# Patient Record
Sex: Female | Born: 1945
Health system: Southern US, Community
[De-identification: ages and names within clinical notes are randomized; demographics above are authoritative.]

## PROBLEM LIST (undated history)

## (undated) DIAGNOSIS — E785 Hyperlipidemia, unspecified: Secondary | ICD-10-CM

## (undated) DIAGNOSIS — G5600 Carpal tunnel syndrome, unspecified upper limb: Secondary | ICD-10-CM

## (undated) DIAGNOSIS — Z9889 Other specified postprocedural states: Secondary | ICD-10-CM

## (undated) DIAGNOSIS — Z8489 Family history of other specified conditions: Secondary | ICD-10-CM

## (undated) DIAGNOSIS — R519 Headache, unspecified: Secondary | ICD-10-CM

## (undated) DIAGNOSIS — M810 Age-related osteoporosis without current pathological fracture: Secondary | ICD-10-CM

## (undated) DIAGNOSIS — R51 Headache: Secondary | ICD-10-CM

## (undated) DIAGNOSIS — K219 Gastro-esophageal reflux disease without esophagitis: Secondary | ICD-10-CM

## (undated) DIAGNOSIS — R112 Nausea with vomiting, unspecified: Secondary | ICD-10-CM

## (undated) DIAGNOSIS — E213 Hyperparathyroidism, unspecified: Secondary | ICD-10-CM

## (undated) DIAGNOSIS — H269 Unspecified cataract: Secondary | ICD-10-CM

## (undated) DIAGNOSIS — C801 Malignant (primary) neoplasm, unspecified: Secondary | ICD-10-CM

## (undated) DIAGNOSIS — J189 Pneumonia, unspecified organism: Secondary | ICD-10-CM

## (undated) HISTORY — PX: TONSILLECTOMY: SUR1361

## (undated) HISTORY — PX: CATARACT EXTRACTION, BILATERAL: SHX1313

## (undated) HISTORY — PX: OTHER SURGICAL HISTORY: SHX169

## (undated) HISTORY — PX: ABDOMINAL HYSTERECTOMY: SHX81

## (undated) HISTORY — DX: Hyperlipidemia, unspecified: E78.5

## (undated) HISTORY — PX: TUBAL LIGATION: SHX77

## (undated) HISTORY — PX: COLONOSCOPY: SHX174

## (undated) HISTORY — PX: APPENDECTOMY: SHX54

## (undated) HISTORY — DX: Malignant (primary) neoplasm, unspecified: C80.1

---

## 1984-11-10 HISTORY — PX: CHOLECYSTECTOMY: SHX55

## 1991-11-11 HISTORY — PX: HERNIA REPAIR: SHX51

## 1998-07-12 ENCOUNTER — Ambulatory Visit (HOSPITAL_COMMUNITY): Admission: RE | Admit: 1998-07-12 | Discharge: 1998-07-12 | Payer: Self-pay | Admitting: Internal Medicine

## 1999-09-04 ENCOUNTER — Ambulatory Visit (HOSPITAL_COMMUNITY): Admission: RE | Admit: 1999-09-04 | Discharge: 1999-09-04 | Payer: Self-pay | Admitting: Internal Medicine

## 1999-09-04 ENCOUNTER — Encounter: Payer: Self-pay | Admitting: Internal Medicine

## 1999-09-16 ENCOUNTER — Other Ambulatory Visit: Admission: RE | Admit: 1999-09-16 | Discharge: 1999-09-16 | Payer: Self-pay | Admitting: *Deleted

## 1999-12-10 ENCOUNTER — Encounter (INDEPENDENT_AMBULATORY_CARE_PROVIDER_SITE_OTHER): Payer: Self-pay

## 1999-12-10 ENCOUNTER — Other Ambulatory Visit: Admission: RE | Admit: 1999-12-10 | Discharge: 1999-12-10 | Payer: Self-pay | Admitting: Gastroenterology

## 2001-01-11 ENCOUNTER — Encounter: Payer: Self-pay | Admitting: Internal Medicine

## 2001-01-11 ENCOUNTER — Ambulatory Visit (HOSPITAL_COMMUNITY): Admission: RE | Admit: 2001-01-11 | Discharge: 2001-01-11 | Payer: Self-pay | Admitting: Internal Medicine

## 2002-05-02 ENCOUNTER — Ambulatory Visit (HOSPITAL_COMMUNITY): Admission: RE | Admit: 2002-05-02 | Discharge: 2002-05-02 | Payer: Self-pay | Admitting: Internal Medicine

## 2002-05-02 ENCOUNTER — Encounter: Payer: Self-pay | Admitting: Internal Medicine

## 2003-08-14 ENCOUNTER — Ambulatory Visit (HOSPITAL_COMMUNITY): Admission: RE | Admit: 2003-08-14 | Discharge: 2003-08-14 | Payer: Self-pay | Admitting: Internal Medicine

## 2003-08-14 ENCOUNTER — Encounter: Payer: Self-pay | Admitting: Internal Medicine

## 2009-02-26 ENCOUNTER — Ambulatory Visit (HOSPITAL_COMMUNITY): Admission: RE | Admit: 2009-02-26 | Discharge: 2009-02-26 | Payer: Self-pay | Admitting: Family Medicine

## 2009-10-08 ENCOUNTER — Encounter (INDEPENDENT_AMBULATORY_CARE_PROVIDER_SITE_OTHER): Payer: Self-pay | Admitting: *Deleted

## 2009-11-08 ENCOUNTER — Encounter (INDEPENDENT_AMBULATORY_CARE_PROVIDER_SITE_OTHER): Payer: Self-pay | Admitting: *Deleted

## 2009-11-12 ENCOUNTER — Ambulatory Visit: Payer: Self-pay | Admitting: Gastroenterology

## 2009-12-11 ENCOUNTER — Telehealth: Payer: Self-pay | Admitting: Gastroenterology

## 2009-12-12 ENCOUNTER — Telehealth: Payer: Self-pay | Admitting: Gastroenterology

## 2009-12-18 ENCOUNTER — Ambulatory Visit: Payer: Self-pay | Admitting: Gastroenterology

## 2010-05-03 ENCOUNTER — Ambulatory Visit (HOSPITAL_COMMUNITY): Admission: RE | Admit: 2010-05-03 | Discharge: 2010-05-03 | Payer: Self-pay | Admitting: Family Medicine

## 2010-12-10 NOTE — Progress Notes (Signed)
Summary: ? re prep   Phone Note Call from Patient Call back at Home Phone [phone]   Caller: Patient Call For: Arlyce Dice Reason for Call: Talk to Nurse Summary of Call: Patient has  questions regarding prep and meds before her procedure Initial call taken by: Tawni Levy,  December 11, 2009 11:59 AM  Follow-up for Phone Call        Pt. phone disconnected. Follow-up by: Wyona Almas RN,  December 11, 2009 12:33 PM

## 2010-12-10 NOTE — Progress Notes (Signed)
Summary: Triage  Phone Note Call from Patient Call back at [phone]   Caller: Patient Call For: Dr. Arlyce Dice Summary of Call: Pt wants to know if she can take Vitamins and sleeping medication prior to her appt. Initial call taken by: Karna Christmas,  December 12, 2009 11:57 AM  Follow-up for Phone Call        Phone Call Completed Follow-up by: Wyona Almas RN,  December 12, 2009 12:58 PM

## 2010-12-10 NOTE — Procedures (Signed)
Summary: Colonoscopy  Patient: Alexandria Hudson Note: All result statuses are Final unless otherwise noted.  Tests: (1) Colonoscopy (COL)   COL Colonoscopy           DONE     [facility]     [address]     [place], [place]  [address]           COLONOSCOPY PROCEDURE REPORT           PATIENT:  Alexandria, Hudson  MR#:  [id number]     BIRTHDATE:  06/21/1946, 63 yrs. old  GENDER:  female           ENDOSCOPIST:  Barbette Hair. Arlyce Dice, MD     Referred by:           PROCEDURE DATE:  12/18/2009     PROCEDURE:  Colonoscopy, Diagnostic     ASA CLASS:  Class I     INDICATIONS:  history of pre-cancerous (adenomatous) colon polyps                 MEDICATIONS:   Fentanyl 100 mcg IV, Versed 10 mg IV           DESCRIPTION OF PROCEDURE:   After the risks benefits and     alternatives of the procedure were thoroughly explained, informed     consent was obtained.  Digital rectal exam was performed and     revealed no abnormalities.   The LB CF-[id number] endoscope     was introduced through the anus and advanced to the cecum, which     was identified by both the appendix and ileocecal valve, without     limitations.  The quality of the prep was excellent, using     MoviPrep.  The instrument was then slowly withdrawn as the colon     was fully examined.     <<PROCEDUREIMAGES>>           FINDINGS:  Moderate diverticulosis was found in the sigmoid colon     (see image2).  This was otherwise a normal examination of the     colon (see image3, image4, image5, image7, image8, image12,     image13, and image14).   Retroflexed views in the rectum revealed     no abnormalities.    The scope was then withdrawn from the patient     and the procedure completed.           COMPLICATIONS:  None           ENDOSCOPIC IMPRESSION:     1) Moderate diverticulosis in the sigmoid colon     2) Otherwise normal examination     RECOMMENDATIONS:     1) colonoscopy in 10 years           REPEAT EXAM:  In  10 year(s) for Colonoscopy.           ______________________________     Barbette Hair. Arlyce Dice, MD           CC:  Shaune Pollack, MD           n.     Rosalie Doctor:   Barbette Hair. Ellean Firman at 12/18/2009 09:03 AM           Alton Revere, [id number]  Note: An exclamation mark (!) indicates a result that was not dispersed into the flowsheet. Document Creation Date: 12/18/2009 9:03 AM _______________________________________________________________________  (1) Order result status: Final Collection or observation date-time: 12/18/2009 08:56 Requested date-time:  Receipt  date-time:  Reported date-time:  Referring Physician:   Ordering Physician: Melvia Heaps [id number]) Specimen Source:  Source: Launa Grill Order Number: [id number] Lab site:   Appended Document: Colonoscopy    Clinical Lists Changes  Observations: Added new observation of COLONNXTDUE: 12/2019 (12/18/2009 11:21)

## 2010-12-10 NOTE — Miscellaneous (Signed)
Summary: REC COL/SCRNING...AS.  Clinical Lists Changes  Medications: Added new medication of MOVIPREP 100 GM  SOLR (PEG-KCL-NACL-NASULF-NA ASC-C) As directed - Signed Rx of MOVIPREP 100 GM  SOLR (PEG-KCL-NACL-NASULF-NA ASC-C) As directed;  #1 x 0;  Signed;  Entered by: Clide Cliff RN;  Authorized by: Louis Meckel MD;  Method used: Electronically to [facility] [address] [place], [place], [place]  [address], Ph: [phone], Fax: [phone] Allergies: Added new allergy or adverse reaction of BETADINE Observations: Added new observation of NKA: F (11/12/2009 13:16)    Prescriptions: MOVIPREP 100 GM  SOLR (PEG-KCL-NACL-NASULF-NA ASC-C) As directed  #1 x 0   Entered by:   Clide Cliff RN   Authorized by:   Louis Meckel MD   Signed by:   Clide Cliff RN on 11/12/2009   Method used:   Electronically to        [facility] (retail)       [address]       [place], [place]  [address]       Ph: [phone]       Fax: [phone]   RxID:   [id number]

## 2011-01-02 ENCOUNTER — Other Ambulatory Visit: Payer: Self-pay | Admitting: Dermatology

## 2011-07-07 ENCOUNTER — Other Ambulatory Visit (HOSPITAL_COMMUNITY): Payer: Self-pay | Admitting: Family Medicine

## 2011-07-07 DIAGNOSIS — Z1231 Encounter for screening mammogram for malignant neoplasm of breast: Secondary | ICD-10-CM

## 2011-07-10 ENCOUNTER — Ambulatory Visit (HOSPITAL_COMMUNITY)
Admission: RE | Admit: 2011-07-10 | Discharge: 2011-07-10 | Disposition: A | Discharge status: Home / Self Care | Payer: BC Managed Care – PPO | Source: Ambulatory Visit | Attending: Family Medicine | Admitting: Family Medicine

## 2011-07-10 DIAGNOSIS — Z1231 Encounter for screening mammogram for malignant neoplasm of breast: Secondary | ICD-10-CM | POA: Insufficient documentation

## 2012-08-31 ENCOUNTER — Other Ambulatory Visit (HOSPITAL_COMMUNITY): Payer: Self-pay | Admitting: *Deleted

## 2012-08-31 ENCOUNTER — Other Ambulatory Visit (HOSPITAL_COMMUNITY): Payer: Self-pay | Admitting: Family Medicine

## 2012-08-31 DIAGNOSIS — Z1231 Encounter for screening mammogram for malignant neoplasm of breast: Secondary | ICD-10-CM

## 2012-09-21 ENCOUNTER — Ambulatory Visit (HOSPITAL_COMMUNITY)
Admission: RE | Admit: 2012-09-21 | Discharge: 2012-09-21 | Disposition: A | Discharge status: Home / Self Care | Payer: BC Managed Care – PPO | Source: Ambulatory Visit | Attending: Family Medicine | Admitting: Family Medicine

## 2012-09-21 DIAGNOSIS — Z1231 Encounter for screening mammogram for malignant neoplasm of breast: Secondary | ICD-10-CM

## 2013-12-02 ENCOUNTER — Other Ambulatory Visit (HOSPITAL_COMMUNITY): Payer: Self-pay | Admitting: Family Medicine

## 2013-12-02 DIAGNOSIS — Z1231 Encounter for screening mammogram for malignant neoplasm of breast: Secondary | ICD-10-CM

## 2013-12-08 ENCOUNTER — Ambulatory Visit (HOSPITAL_COMMUNITY)
Admission: RE | Admit: 2013-12-08 | Discharge: 2013-12-08 | Disposition: A | Discharge status: Home / Self Care | Payer: Medicare Other | Source: Ambulatory Visit | Attending: Family Medicine | Admitting: Family Medicine

## 2013-12-08 DIAGNOSIS — Z1231 Encounter for screening mammogram for malignant neoplasm of breast: Secondary | ICD-10-CM

## 2014-11-15 ENCOUNTER — Other Ambulatory Visit: Payer: Self-pay | Admitting: Dermatology

## 2015-01-10 ENCOUNTER — Other Ambulatory Visit (HOSPITAL_COMMUNITY): Payer: Self-pay | Admitting: Family Medicine

## 2015-01-10 DIAGNOSIS — Z1231 Encounter for screening mammogram for malignant neoplasm of breast: Secondary | ICD-10-CM

## 2015-01-11 ENCOUNTER — Ambulatory Visit (HOSPITAL_COMMUNITY)
Admission: RE | Admit: 2015-01-11 | Discharge: 2015-01-11 | Disposition: A | Discharge status: Home / Self Care | Payer: Medicare Other | Source: Ambulatory Visit | Attending: Family Medicine | Admitting: Family Medicine

## 2015-01-11 DIAGNOSIS — Z1231 Encounter for screening mammogram for malignant neoplasm of breast: Secondary | ICD-10-CM | POA: Diagnosis not present

## 2015-01-16 ENCOUNTER — Ambulatory Visit (HOSPITAL_COMMUNITY): Payer: Self-pay

## 2015-10-03 ENCOUNTER — Other Ambulatory Visit: Payer: Self-pay | Admitting: Family Medicine

## 2015-10-03 DIAGNOSIS — N644 Mastodynia: Secondary | ICD-10-CM

## 2015-10-11 ENCOUNTER — Ambulatory Visit
Admission: RE | Admit: 2015-10-11 | Discharge: 2015-10-11 | Disposition: A | Discharge status: Home / Self Care | Payer: Medicare Other | Source: Ambulatory Visit | Attending: Family Medicine | Admitting: Family Medicine

## 2015-10-11 DIAGNOSIS — N644 Mastodynia: Secondary | ICD-10-CM

## 2016-01-28 ENCOUNTER — Encounter: Payer: Self-pay | Admitting: Gastroenterology

## 2016-01-28 ENCOUNTER — Other Ambulatory Visit: Payer: Self-pay

## 2016-01-28 DIAGNOSIS — Z1231 Encounter for screening mammogram for malignant neoplasm of breast: Secondary | ICD-10-CM

## 2016-02-07 ENCOUNTER — Ambulatory Visit
Admission: RE | Admit: 2016-02-07 | Discharge: 2016-02-07 | Disposition: A | Discharge status: Home / Self Care | Payer: Medicare Other | Source: Ambulatory Visit

## 2016-02-07 DIAGNOSIS — Z1231 Encounter for screening mammogram for malignant neoplasm of breast: Secondary | ICD-10-CM

## 2017-02-10 DIAGNOSIS — E507 Other ocular manifestations of vitamin A deficiency: Secondary | ICD-10-CM | POA: Insufficient documentation

## 2017-02-10 DIAGNOSIS — K219 Gastro-esophageal reflux disease without esophagitis: Secondary | ICD-10-CM | POA: Insufficient documentation

## 2017-02-10 DIAGNOSIS — J329 Chronic sinusitis, unspecified: Secondary | ICD-10-CM | POA: Insufficient documentation

## 2017-02-10 DIAGNOSIS — K117 Disturbances of salivary secretion: Secondary | ICD-10-CM | POA: Insufficient documentation

## 2017-02-10 DIAGNOSIS — R490 Dysphonia: Secondary | ICD-10-CM | POA: Insufficient documentation

## 2017-12-02 ENCOUNTER — Other Ambulatory Visit: Payer: Self-pay | Admitting: Family Medicine

## 2017-12-02 ENCOUNTER — Ambulatory Visit
Admission: RE | Admit: 2017-12-02 | Discharge: 2017-12-02 | Disposition: A | Discharge status: Home / Self Care | Payer: Medicare Other | Source: Ambulatory Visit | Attending: Family Medicine | Admitting: Family Medicine

## 2017-12-02 DIAGNOSIS — Z1231 Encounter for screening mammogram for malignant neoplasm of breast: Secondary | ICD-10-CM

## 2018-08-09 ENCOUNTER — Telehealth: Payer: Self-pay | Admitting: *Deleted

## 2018-08-09 NOTE — Telephone Encounter (Signed)
Patient called c/o something protruding out vagina, seen Dr. Dellis Filbert about 4 years ago,wanted to schedule visit with her. I told her she will need to have records faxed from [facility] Patient will follow up once all completed.

## 2018-12-28 ENCOUNTER — Other Ambulatory Visit: Payer: Self-pay

## 2018-12-28 ENCOUNTER — Encounter (HOSPITAL_BASED_OUTPATIENT_CLINIC_OR_DEPARTMENT_OTHER): Payer: Self-pay | Admitting: *Deleted

## 2018-12-28 NOTE — Progress Notes (Addendum)
SPOKE WITH [facility] NPO AFTER MIDNIGHT FOOD, CLEAR LIQUIDS UNTIL 430 AM AND DRINK PRE SURGERY SHAKE AT 430 AM THEN NPO ARRIVE 530 AM [facility] TO TAKE OMEPRAZOLE PATIENT GIVEN OVERNIGHT STAY INSTRUCTIONS INCLUDING BRING ALL PRESCRIPTION MEDS IN ORIGINAL CONTAINERS HAS SURGERY ORDERS IN Epic HAS LAB APPOINTMENT 12-31-2018 FOR CBC, BMET TYPE AND SCREEN AND PICK UP PRE SURGERY SHAKE

## 2018-12-29 ENCOUNTER — Encounter (HOSPITAL_COMMUNITY)
Admission: RE | Admit: 2018-12-29 | Discharge: 2018-12-29 | Disposition: A | Discharge status: Home / Self Care | Payer: Medicare Other | Source: Ambulatory Visit | Attending: Obstetrics & Gynecology | Admitting: Obstetrics & Gynecology

## 2018-12-29 DIAGNOSIS — Z01812 Encounter for preprocedural laboratory examination: Secondary | ICD-10-CM | POA: Insufficient documentation

## 2018-12-29 LAB — COMPREHENSIVE METABOLIC PANEL
ALT: 17 U/L (ref 0–44)
AST: 21 U/L (ref 15–41)
Albumin: 4.2 g/dL (ref 3.5–5.0)
Alkaline Phosphatase: 66 U/L (ref 38–126)
Anion gap: 5 (ref 5–15)
BUN: 14 mg/dL (ref 8–23)
CO2: 28 mmol/L (ref 22–32)
Calcium: 10.1 mg/dL (ref 8.9–10.3)
Chloride: 108 mmol/L (ref 98–111)
Creatinine, Ser: 0.66 mg/dL (ref 0.44–1.00)
GFR calc Af Amer: >60 mL/min (ref >60)
GFR calc non Af Amer: >60 mL/min (ref >60)
Glucose, Bld: 86 mg/dL (ref 70–99)
POTASSIUM: 4.1 mmol/L (ref 3.5–5.1)
Sodium: 141 mmol/L (ref 135–145)
Total Bilirubin: 0.7 mg/dL (ref 0.3–1.2)
Total Protein: 7 g/dL (ref 6.5–8.1)

## 2018-12-29 LAB — CBC
HCT: 43.1 % (ref 36.0–46.0)
Hemoglobin: 13.7 g/dL (ref 12.0–15.0)
MCH: 30.3 pg (ref 26.0–34.0)
MCHC: 31.8 g/dL (ref 30.0–36.0)
MCV: 95.4 fL (ref 80.0–100.0)
Platelets: 269 10*3/uL (ref 150–400)
RBC: 4.52 MIL/uL (ref 3.87–5.11)
RDW: 13.6 % (ref 11.5–15.5)
WBC: 6.4 10*3/uL (ref 4.0–10.5)
nRBC: 0 % (ref 0.0–0.2)

## 2018-12-29 LAB — ABO/RH: ABO/RH(D): O POS

## 2018-12-31 ENCOUNTER — Encounter (HOSPITAL_COMMUNITY): Admission: RE | Admit: 2018-12-31 | Payer: Medicare Other | Source: Ambulatory Visit

## 2018-12-31 NOTE — H&P (Signed)
Alexandria Hudson is an 73 y.o. female with symptomatic Stage III cystocele and rectocele and loss of apical support; s/p hysterectomy.  Patient declines pessary.  She has no urinary or fecal incontinence.  She is sexually active.  Urodynamic studies showed no SUI.    Pertinent Gynecological History: Menses: post-menopausal, hyst Bleeding: n/a Contraception: status post hysterectomy DES exposure: unknown Blood transfusions: none Sexually transmitted diseases: no past history Previous GYN Procedures: abd hyst, BTL  Last mammogram: normal Date: ? Last pap: n/a Date: n/a OB History: G2, P2   Menstrual History: Menarche age: n/a No LMP recorded. Patient has had a hysterectomy.    Past Medical History:  Diagnosis Date  . Carpal tunnel syndrome    BOTH WRISTS   . Cataracts, both eyes   . GERD (gastroesophageal reflux disease)   . Headache    MIGRAINES  . Osteoporosis   . PONV (postoperative nausea and vomiting)    NO NAUSEA SINCE TUBES TIED    Past Surgical History:  Procedure Laterality Date  . ABDOMINAL HYSTERECTOMY  38 YRS AGO   PARTIAL  . CHOLECYSTECTOMY  1986  . HERNIA REPAIR  1993   INGUINAL  . SKIN CANCER AREAS REMOVED    . TONSILLECTOMY    . TUBAL LIGATION      History reviewed. No pertinent family history.  Social History:  reports that she has never smoked. She has never used smokeless tobacco. She reports that she does not use drugs. No history on file for alcohol.  Allergies:  Allergies  Allergen Reactions  . Povidone-Iodine     REACTION: skin irritation    No medications prior to admission.    ROS  Height 5\' 4"  (1.626 m), weight 61.7 kg. Physical Exam  Constitutional: She is oriented to person, place, and time. She appears well-developed and well-nourished.  GI: Soft. There is no rebound and no guarding.  Neurological: She is alert and oriented to person, place, and time.  Skin: Skin is warm and dry.  Psychiatric: She has a normal mood and  affect. Her behavior is normal.    No results found for this or any previous visit (from the past 24 hour(s)).  No results found.  Assessment/Plan: 72yo with pelvic organ prolapse -Anterior and posterior colporrhaphy, SSLF -Patient is counseled re: risk of bleeding, infection, scarring, and damage to surrounding structures.  She understands the risk of pain and recurrence of prolapse.  All questions were answered and the patient wishes to proceed.  Linda Hedges 12/31/2018, 12:56 PM

## 2019-01-03 ENCOUNTER — Observation Stay (HOSPITAL_BASED_OUTPATIENT_CLINIC_OR_DEPARTMENT_OTHER): Payer: Medicare Other | Admitting: Anesthesiology

## 2019-01-03 ENCOUNTER — Observation Stay (HOSPITAL_BASED_OUTPATIENT_CLINIC_OR_DEPARTMENT_OTHER)
Admission: RE | Admit: 2019-01-03 | Discharge: 2019-01-04 | Disposition: A | Discharge status: Home / Self Care | Payer: Medicare Other | Attending: Obstetrics & Gynecology | Admitting: Obstetrics & Gynecology

## 2019-01-03 ENCOUNTER — Encounter (HOSPITAL_BASED_OUTPATIENT_CLINIC_OR_DEPARTMENT_OTHER): Payer: Self-pay | Admitting: Emergency Medicine

## 2019-01-03 ENCOUNTER — Other Ambulatory Visit: Payer: Self-pay

## 2019-01-03 ENCOUNTER — Encounter (HOSPITAL_BASED_OUTPATIENT_CLINIC_OR_DEPARTMENT_OTHER)
Admission: RE | Disposition: A | Discharge status: Home / Self Care | Payer: Self-pay | Source: Home / Self Care | Attending: Obstetrics & Gynecology

## 2019-01-03 DIAGNOSIS — N816 Rectocele: Secondary | ICD-10-CM | POA: Diagnosis not present

## 2019-01-03 DIAGNOSIS — Z888 Allergy status to other drugs, medicaments and biological substances status: Secondary | ICD-10-CM | POA: Insufficient documentation

## 2019-01-03 DIAGNOSIS — N819 Female genital prolapse, unspecified: Secondary | ICD-10-CM | POA: Diagnosis present

## 2019-01-03 DIAGNOSIS — K219 Gastro-esophageal reflux disease without esophagitis: Secondary | ICD-10-CM | POA: Insufficient documentation

## 2019-01-03 DIAGNOSIS — Z85828 Personal history of other malignant neoplasm of skin: Secondary | ICD-10-CM | POA: Insufficient documentation

## 2019-01-03 DIAGNOSIS — Z886 Allergy status to analgesic agent status: Secondary | ICD-10-CM | POA: Diagnosis not present

## 2019-01-03 DIAGNOSIS — N813 Complete uterovaginal prolapse: Secondary | ICD-10-CM | POA: Diagnosis present

## 2019-01-03 HISTORY — DX: Carpal tunnel syndrome, unspecified upper limb: G56.00

## 2019-01-03 HISTORY — DX: Headache: R51

## 2019-01-03 HISTORY — PX: ANTERIOR AND POSTERIOR REPAIR WITH SACROSPINOUS FIXATION: SHX6536

## 2019-01-03 HISTORY — DX: Headache, unspecified: R51.9

## 2019-01-03 HISTORY — DX: Age-related osteoporosis without current pathological fracture: M81.0

## 2019-01-03 HISTORY — DX: Unspecified cataract: H26.9

## 2019-01-03 HISTORY — DX: Other specified postprocedural states: R11.2

## 2019-01-03 HISTORY — DX: Gastro-esophageal reflux disease without esophagitis: K21.9

## 2019-01-03 HISTORY — DX: Other specified postprocedural states: Z98.890

## 2019-01-03 LAB — TYPE AND SCREEN
ABO/RH(D): O POS
ANTIBODY SCREEN: NEGATIVE

## 2019-01-03 SURGERY — ANTERIOR AND POSTERIOR REPAIR WITH SACROSPINOUS FIXATION
Anesthesia: General | Site: Perineum

## 2019-01-03 MED ORDER — DOCUSATE SODIUM 100 MG PO CAPS
ORAL_CAPSULE | ORAL | Status: AC
  Filled 2019-01-03: qty 1

## 2019-01-03 MED ORDER — FENTANYL CITRATE (PF) 100 MCG/2ML IJ SOLN
INTRAMUSCULAR | Status: AC
  Filled 2019-01-03: qty 2

## 2019-01-03 MED ORDER — CEFAZOLIN SODIUM-DEXTROSE 2-4 GM/100ML-% IV SOLN
INTRAVENOUS | Status: AC
  Filled 2019-01-03: qty 100

## 2019-01-03 MED ORDER — PROPOFOL 500 MG/50ML IV EMUL
INTRAVENOUS | Status: DC | PRN
  Administered 2019-01-03: 100 ug/kg/min via INTRAVENOUS

## 2019-01-03 MED ORDER — CEFAZOLIN SODIUM-DEXTROSE 2-4 GM/100ML-% IV SOLN
2.0000 g | INTRAVENOUS | Status: AC
  Administered 2019-01-03: 2 g via INTRAVENOUS
  Filled 2019-01-03: qty 100

## 2019-01-03 MED ORDER — LACTATED RINGERS IV SOLN
INTRAVENOUS | Status: DC
  Administered 2019-01-03: 07:00:00 via INTRAVENOUS
  Filled 2019-01-03: qty 1000

## 2019-01-03 MED ORDER — ACETAMINOPHEN 500 MG PO TABS
ORAL_TABLET | ORAL | Status: AC
  Filled 2019-01-03: qty 2

## 2019-01-03 MED ORDER — DOCUSATE SODIUM 100 MG PO CAPS
100.0000 mg | ORAL_CAPSULE | Freq: Two times a day (BID) | ORAL | Status: DC
  Administered 2019-01-03 (×2): 100 mg via ORAL
  Filled 2019-01-03: qty 1

## 2019-01-03 MED ORDER — OXYCODONE HCL 5 MG PO TABS
5.0000 mg | ORAL_TABLET | ORAL | Status: DC | PRN
  Filled 2019-01-03: qty 2

## 2019-01-03 MED ORDER — PROPOFOL 10 MG/ML IV BOLUS
INTRAVENOUS | Status: AC
  Filled 2019-01-03: qty 20

## 2019-01-03 MED ORDER — ESTRADIOL 0.1 MG/GM VA CREA
TOPICAL_CREAM | VAGINAL | Status: DC | PRN
  Administered 2019-01-03: 1 via VAGINAL

## 2019-01-03 MED ORDER — LACTATED RINGERS IV SOLN
INTRAVENOUS | Status: DC
  Administered 2019-01-03: 06:00:00 via INTRAVENOUS
  Filled 2019-01-03 (×2): qty 1000

## 2019-01-03 MED ORDER — MENTHOL 3 MG MT LOZG
1.0000 | LOZENGE | OROMUCOSAL | Status: DC | PRN
  Filled 2019-01-03: qty 9

## 2019-01-03 MED ORDER — PHENYLEPHRINE 40 MCG/ML (10ML) SYRINGE FOR IV PUSH (FOR BLOOD PRESSURE SUPPORT)
PREFILLED_SYRINGE | INTRAVENOUS | Status: AC
  Filled 2019-01-03: qty 10

## 2019-01-03 MED ORDER — KETOROLAC TROMETHAMINE 15 MG/ML IJ SOLN
15.0000 mg | Freq: Four times a day (QID) | INTRAMUSCULAR | Status: DC
  Administered 2019-01-03 – 2019-01-04 (×3): 15 mg via INTRAVENOUS
  Filled 2019-01-03: qty 1

## 2019-01-03 MED ORDER — FENTANYL CITRATE (PF) 100 MCG/2ML IJ SOLN
INTRAMUSCULAR | Status: DC | PRN
  Administered 2019-01-03: 50 ug via INTRAVENOUS
  Administered 2019-01-03 (×2): 25 ug via INTRAVENOUS

## 2019-01-03 MED ORDER — FENTANYL CITRATE (PF) 100 MCG/2ML IJ SOLN
25.0000 ug | INTRAMUSCULAR | Status: DC | PRN
  Administered 2019-01-03 (×2): 50 ug via INTRAVENOUS
  Filled 2019-01-03: qty 1

## 2019-01-03 MED ORDER — KETOROLAC TROMETHAMINE 30 MG/ML IJ SOLN
INTRAMUSCULAR | Status: AC
  Filled 2019-01-03: qty 1

## 2019-01-03 MED ORDER — ACETAMINOPHEN 500 MG PO TABS
1000.0000 mg | ORAL_TABLET | Freq: Once | ORAL | Status: DC
  Filled 2019-01-03: qty 2

## 2019-01-03 MED ORDER — ONDANSETRON HCL 4 MG/2ML IJ SOLN
INTRAMUSCULAR | Status: DC | PRN
  Administered 2019-01-03: 4 mg via INTRAVENOUS

## 2019-01-03 MED ORDER — KETOROLAC TROMETHAMINE 30 MG/ML IJ SOLN
INTRAMUSCULAR | Status: DC | PRN
  Administered 2019-01-03: 30 mg via INTRAVENOUS

## 2019-01-03 MED ORDER — HYDROMORPHONE HCL 1 MG/ML IJ SOLN
0.2000 mg | INTRAMUSCULAR | Status: DC | PRN
  Filled 2019-01-03: qty 1

## 2019-01-03 MED ORDER — LACTATED RINGERS IV SOLN
INTRAVENOUS | Status: DC
  Filled 2019-01-03: qty 1000

## 2019-01-03 MED ORDER — PROPOFOL 10 MG/ML IV BOLUS
INTRAVENOUS | Status: DC | PRN
  Administered 2019-01-03: 160 mg via INTRAVENOUS

## 2019-01-03 MED ORDER — DEXAMETHASONE SODIUM PHOSPHATE 10 MG/ML IJ SOLN
INTRAMUSCULAR | Status: AC
  Filled 2019-01-03: qty 1

## 2019-01-03 MED ORDER — ARTIFICIAL TEARS OPHTHALMIC OINT
TOPICAL_OINTMENT | OPHTHALMIC | Status: AC
  Filled 2019-01-03: qty 3.5

## 2019-01-03 MED ORDER — ONDANSETRON HCL 4 MG/2ML IJ SOLN
INTRAMUSCULAR | Status: AC
  Filled 2019-01-03: qty 2

## 2019-01-03 MED ORDER — PHENYLEPHRINE HCL 10 MG/ML IJ SOLN
INTRAMUSCULAR | Status: DC | PRN
  Administered 2019-01-03: 80 ug via INTRAVENOUS

## 2019-01-03 MED ORDER — LIDOCAINE-EPINEPHRINE (PF) 1 %-1:200000 IJ SOLN
INTRAMUSCULAR | Status: DC | PRN
  Administered 2019-01-03: 7 mL

## 2019-01-03 MED ORDER — ONDANSETRON HCL 4 MG/2ML IJ SOLN
4.0000 mg | Freq: Once | INTRAMUSCULAR | Status: DC | PRN
  Filled 2019-01-03: qty 2

## 2019-01-03 MED ORDER — PROPOFOL 500 MG/50ML IV EMUL
INTRAVENOUS | Status: AC
  Filled 2019-01-03: qty 50

## 2019-01-03 MED ORDER — ONDANSETRON HCL 4 MG PO TABS
4.0000 mg | ORAL_TABLET | Freq: Four times a day (QID) | ORAL | Status: DC | PRN
  Filled 2019-01-03: qty 1

## 2019-01-03 MED ORDER — LIDOCAINE 2% (20 MG/ML) 5 ML SYRINGE
INTRAMUSCULAR | Status: AC
  Filled 2019-01-03: qty 5

## 2019-01-03 MED ORDER — SIMETHICONE 80 MG PO CHEW
80.0000 mg | CHEWABLE_TABLET | Freq: Four times a day (QID) | ORAL | Status: DC | PRN
  Filled 2019-01-03: qty 1

## 2019-01-03 MED ORDER — ONDANSETRON HCL 4 MG/2ML IJ SOLN
4.0000 mg | Freq: Four times a day (QID) | INTRAMUSCULAR | Status: DC | PRN
  Filled 2019-01-03: qty 2

## 2019-01-03 MED ORDER — DEXAMETHASONE SODIUM PHOSPHATE 10 MG/ML IJ SOLN
INTRAMUSCULAR | Status: DC | PRN
  Administered 2019-01-03: 10 mg via INTRAVENOUS

## 2019-01-03 MED ORDER — LIDOCAINE 2% (20 MG/ML) 5 ML SYRINGE
INTRAMUSCULAR | Status: DC | PRN
  Administered 2019-01-03: 60 mg via INTRAVENOUS

## 2019-01-03 SURGICAL SUPPLY — 44 items
BLADE SURG 11 STRL SS (BLADE) ×3 IMPLANT
BLADE SURG 15 STRL LF DISP TIS (BLADE) IMPLANT
BLADE SURG 15 STRL SS (BLADE)
BNDG GAUZE ELAST 4 BULKY (GAUZE/BANDAGES/DRESSINGS) ×2 IMPLANT
BRIEF STRETCH FOR OB PAD LRG (UNDERPADS AND DIAPERS) ×1 IMPLANT
CATH ROBINSON RED A/P 16FR (CATHETERS) ×3 IMPLANT
CLOTH BEACON ORANGE TIMEOUT ST (SAFETY) ×3 IMPLANT
COVER MAYO STAND STRL (DRAPES) ×3 IMPLANT
COVER WAND RF STERILE (DRAPES) ×3 IMPLANT
DECANTER SPIKE VIAL GLASS SM (MISCELLANEOUS) IMPLANT
DEVICE CAPIO SLIM SINGLE (INSTRUMENTS) ×2 IMPLANT
DRAPE SHEET LG 3/4 BI-LAMINATE (DRAPES) ×3 IMPLANT
GLOVE BIO SURGEON STRL SZ 6 (GLOVE) ×4 IMPLANT
GLOVE BIOGEL PI IND STRL 6 (GLOVE) ×2 IMPLANT
GLOVE BIOGEL PI IND STRL 7.0 (GLOVE) IMPLANT
GLOVE BIOGEL PI IND STRL 7.5 (GLOVE) IMPLANT
GLOVE BIOGEL PI IND STRL 8.5 (GLOVE) IMPLANT
GLOVE BIOGEL PI INDICATOR 6 (GLOVE) ×2
GLOVE BIOGEL PI INDICATOR 7.0 (GLOVE) ×2
GLOVE BIOGEL PI INDICATOR 7.5 (GLOVE) ×2
GLOVE BIOGEL PI INDICATOR 8.5 (GLOVE) ×4
GLOVE ECLIPSE 6.5 STRL STRAW (GLOVE) ×2 IMPLANT
GLOVE ECLIPSE 7.0 STRL STRAW (GLOVE) ×2 IMPLANT
GLOVE INDICATOR 8.5 STRL (GLOVE) ×4 IMPLANT
GOWN STRL REUS W/TWL LRG LVL3 (GOWN DISPOSABLE) ×12 IMPLANT
KIT TURNOVER CYSTO (KITS) ×3 IMPLANT
NDL MAYO 6 CRC TAPER PT (NEEDLE) IMPLANT
NEEDLE HYPO 22GX1.5 SAFETY (NEEDLE) IMPLANT
NEEDLE MAYO .5 CIRCLE (NEEDLE) IMPLANT
NEEDLE MAYO 6 CRC TAPER PT (NEEDLE) ×3 IMPLANT
NS IRRIG 500ML POUR BTL (IV SOLUTION) ×3 IMPLANT
PACK VAGINAL WOMENS (CUSTOM PROCEDURE TRAY) ×4 IMPLANT
PAD OB MATERNITY 4.3X12.25 (PERSONAL CARE ITEMS) ×3 IMPLANT
SET IRRIG Y TYPE TUR BLADDER L (SET/KITS/TRAYS/PACK) IMPLANT
SUT CAPIO POLYGLYCOLIC (SUTURE) ×2 IMPLANT
SUT MON AB 3-0 SH 27 (SUTURE)
SUT MON AB 3-0 SH27 (SUTURE) IMPLANT
SUT VIC AB 2-0 CT1 27 (SUTURE)
SUT VIC AB 2-0 CT1 TAPERPNT 27 (SUTURE) IMPLANT
SUT VIC AB 2-0 CT2 27 (SUTURE) ×3 IMPLANT
SUT VIC AB 2-0 SH 18 (SUTURE) ×2 IMPLANT
TOWEL OR 17X26 10 PK STRL BLUE (TOWEL DISPOSABLE) ×4 IMPLANT
TRAY FOLEY W/BAG SLVR 14FR (SET/KITS/TRAYS/PACK) ×1 IMPLANT
WATER STERILE IRR 500ML POUR (IV SOLUTION) ×1 IMPLANT

## 2019-01-03 NOTE — Op Note (Signed)
PROCEDURE DATE:01/03/2019 PREOPERATIVE DIAGNOSIS:Pelvic organ prolapse POSTOPERATIVE DIAGNOSIS: The same  PROCEDURE: Anterior and Posterior colporrhaphy, Sacrospinous ligament fixation SURGEON: Dr. Linda Hedges  ASSISTANT: Dr.John McComb INDICATIONS:73y.o. with symptomatic pelvic organ prolapse status post hysterectomy desiring definitive surgical management. Risks of surgery were discussed with the patient including but not limited to: bleeding which may require transfusion or reoperation; infection which may require antibiotics; injury to bowel, bladder, ureters or other surrounding organs; need for additional procedures including laparotomy; thromboembolic phenomenon, incisional problems and other postoperative/anesthesia complications. Written informed consent was obtained.  FINDINGS:  Stage III cystocele and rectocele, loss of apical support ANESTHESIA: General  ESTIMATED BLOOD LOSS:41ml  SPECIMENS: None COMPLICATIONS: None immediate  PROCEDURE IN DETAIL: The patient received intravenous antibiotics and had sequential compression devices applied to her lower extremities while in the preoperative area. She was then taken to the operating room where general anesthesia was administered and was found to be adequate. She was placed in the dorsal lithotomy position, and was prepped and draped in a sterile manner. An in and out catheterization was performed.  A weighted speculum was placed posteriorly.  The anterior repair was then started. A midline incision was made in the vaginal mucosa overlying the cystocele and the bladder was dissected free bilaterally. The bladder bulge was reduced by suturing pubovesical fascia in figure of eight monocryl stitches. Excess vaginal mucosa was excised. The incision was then closed with 2-0 vicryl suture in a running locked fashion. Attention was then turned to the posterior repair. Theperineal skinwas graspedbilaterally anda triangular shapewas  removedusing sharp dissection. A midline incision was then created overlying the rectum and the rectum was dissected free bilaterally. The perirectal fossa was then entered on the right side and the right ischial spine of the pubis was carefully identified. The rightsacrospinous ligament inferior and medial to this was palpated and cleared on both sides for approximately 2 to 3 cm. At this point, the Capioneedle was passed on the patient's rightside and using the permanent suture, was secured into the sacrospinous ligament under direct contact 2-3 cm medial to the spine. The bullets were both removed and using a Mayo needle, the apical vaginal muscularis was sutured. The suture was tagged. Using 0 Vicryl interrupted sutures, the rectocele was reduced by bringing together perirectal fascia. Effective reduction of the rectocele was noted. There was some redundant vaginal tissue, however, not enough to required trimming. The vagina was then closed posteriorly with a 2-0 Vicryl suture in a running locked fashion. Half-way through the closure, the sacrospinous ligament fixation suture was tied and elevation of the apex was noted. The remainder of the vaginal closure was performed after the permanent suture was cut. The perineal body was brought together and the remainder of the closure was performed in a second degree laceration repair fashion.Foley catheter was placed and drained clear yellow urine. Premarin coated Kerlix was packed vaginally.  The patient tolerated the procedures well. All instruments, needles, and sponge counts were correct x 2. The patient was taken to the recovery room awake, extubated and in stable condition.

## 2019-01-03 NOTE — Anesthesia Postprocedure Evaluation (Signed)
Anesthesia Post Note  Patient: Alexandria Hudson  Procedure(s) Performed: ANTERIOR AND POSTERIOR REPAIR WITH SACROSPINOUS FIXATION (N/A Perineum)     Patient location during evaluation: PACU Anesthesia Type: General Level of consciousness: awake and alert Pain management: pain level controlled Vital Signs Assessment: post-procedure vital signs reviewed and stable Respiratory status: spontaneous breathing, nonlabored ventilation and respiratory function stable Cardiovascular status: blood pressure returned to baseline and stable Postop Assessment: no apparent nausea or vomiting Anesthetic complications: no    Last Vitals:  Vitals:   01/03/19 1045 01/03/19 1143  BP: 139/76 116/71  Pulse: 74 82  Resp: 17 16  Temp: 36.5 C (!) 36.4 C  SpO2: 95% 99%    Last Pain:  Vitals:   01/03/19 1143  TempSrc:   PainSc: 1                  Catalina Gravel

## 2019-01-03 NOTE — Anesthesia Preprocedure Evaluation (Addendum)
Anesthesia Evaluation  Patient identified by MRN, date of birth, ID band Patient awake    Reviewed: Allergy & Precautions, NPO status , Patient's Chart, lab work & pertinent test results  History of Anesthesia Complications (+) PONV and history of anesthetic complications  Airway Mallampati: II  TM Distance: >3 FB Neck ROM: Full    Dental  (+) Teeth Intact, Dental Advisory Given, Caps,    Pulmonary neg pulmonary ROS,    Pulmonary exam normal breath sounds clear to auscultation       Cardiovascular negative cardio ROS Normal cardiovascular exam Rhythm:Regular Rate:Normal     Neuro/Psych  Headaches,    GI/Hepatic Neg liver ROS, GERD  Medicated and Controlled,  Endo/Other  negative endocrine ROS  Renal/GU negative Renal ROS     Musculoskeletal negative musculoskeletal ROS (+)   Abdominal   Peds  Hematology negative hematology ROS (+)   Anesthesia Other Findings Day of surgery medications reviewed with the patient.  Reproductive/Obstetrics  PELVIC ORGAN PROLAPSE                          Anesthesia Physical Anesthesia Plan  ASA: II  Anesthesia Plan: General   Post-op Pain Management:    Induction: Intravenous  PONV Risk Score and Plan: 4 or greater and Dexamethasone, Ondansetron, Treatment may vary due to age or medical condition and Propofol infusion  Airway Management Planned: Oral ETT  Additional Equipment:   Intra-op Plan:   Post-operative Plan: Extubation in OR  Informed Consent: I have reviewed the patients History and Physical, chart, labs and discussed the procedure including the risks, benefits and alternatives for the proposed anesthesia with the patient or authorized representative who has indicated his/her understanding and acceptance.     Dental advisory given  Plan Discussed with: CRNA  Anesthesia Plan Comments:         Anesthesia Quick Evaluation

## 2019-01-03 NOTE — Progress Notes (Signed)
No change to H&P.  Ansen Sayegh, DO 

## 2019-01-03 NOTE — Transfer of Care (Signed)
Immediate Anesthesia Transfer of Care Note  Patient: Alexandria Hudson  Procedure(s) Performed: ANTERIOR AND POSTERIOR REPAIR WITH SACROSPINOUS FIXATION (N/A Perineum)  Patient Location: PACU  Anesthesia Type:General  Level of Consciousness: awake, alert , oriented and patient cooperative  Airway & Oxygen Therapy: Patient Spontanous Breathing and Patient connected to nasal cannula oxygen  Post-op Assessment: Report given to RN and Post -op Vital signs reviewed and stable  Post vital signs: Reviewed and stable  Last Vitals:  Vitals Value Taken Time  BP    Temp    Pulse 68 01/03/2019  9:16 AM  Resp 15 01/03/2019  9:16 AM  SpO2 100 % 01/03/2019  9:16 AM  Vitals shown include unvalidated device data.  Last Pain:  Vitals:   01/03/19 0546  TempSrc: Oral  PainSc: 0-No pain         Complications: No apparent anesthesia complications

## 2019-01-03 NOTE — Progress Notes (Signed)
NOS  Patient has no c/o.  Has not required narcotics.  No N/V.  No CP/SOB.  VSS. AF. UOP adequate and clear  Gen: A&O x 3 Abd: soft, ND Ext: SCDs on and functioning  POD#0 s/p A/P repair, SSLF -Advance diet -Continue pain control -Ambulate -AM labs pending -D/C IVF, vag pack, and foley in AM  Linda Hedges, DO

## 2019-01-03 NOTE — Anesthesia Procedure Notes (Signed)
Procedure Name: LMA Insertion Date/Time: 01/03/2019 7:32 AM Performed by: Wanita Chamberlain, CRNA Pre-anesthesia Checklist: Patient identified, Emergency Drugs available, Suction available, Patient being monitored and Timeout performed Patient Re-evaluated:Patient Re-evaluated prior to induction Oxygen Delivery Method: Circle system utilized Preoxygenation: Pre-oxygenation with 100% oxygen Induction Type: IV induction Ventilation: Mask ventilation without difficulty LMA: LMA inserted LMA Size: 4.0 Number of attempts: 1 Airway Equipment and Method: Bite block Placement Confirmation: breath sounds checked- equal and bilateral,  CO2 detector and positive ETCO2 Tube secured with: Tape Dental Injury: Teeth and Oropharynx as per pre-operative assessment

## 2019-01-04 ENCOUNTER — Encounter (HOSPITAL_BASED_OUTPATIENT_CLINIC_OR_DEPARTMENT_OTHER): Payer: Self-pay | Admitting: Obstetrics & Gynecology

## 2019-01-04 DIAGNOSIS — N813 Complete uterovaginal prolapse: Secondary | ICD-10-CM | POA: Diagnosis not present

## 2019-01-04 LAB — COMPREHENSIVE METABOLIC PANEL
ALK PHOS: 67 U/L (ref 38–126)
ALT: 17 U/L (ref 0–44)
AST: 19 U/L (ref 15–41)
Albumin: 3.8 g/dL (ref 3.5–5.0)
Anion gap: 6 (ref 5–15)
BUN: 10 mg/dL (ref 8–23)
CALCIUM: 10.2 mg/dL (ref 8.9–10.3)
CO2: 28 mmol/L (ref 22–32)
Chloride: 107 mmol/L (ref 98–111)
Creatinine, Ser: 0.59 mg/dL (ref 0.44–1.00)
GFR calc Af Amer: >60 mL/min (ref >60)
GFR calc non Af Amer: >60 mL/min (ref >60)
Glucose, Bld: 111 mg/dL — ABNORMAL HIGH (ref 70–99)
Potassium: 4 mmol/L (ref 3.5–5.1)
Sodium: 141 mmol/L (ref 135–145)
Total Bilirubin: 0.8 mg/dL (ref 0.3–1.2)
Total Protein: 6.9 g/dL (ref 6.5–8.1)

## 2019-01-04 LAB — CBC
HCT: 42.5 % (ref 36.0–46.0)
Hemoglobin: 13.4 g/dL (ref 12.0–15.0)
MCH: 30.5 pg (ref 26.0–34.0)
MCHC: 31.5 g/dL (ref 30.0–36.0)
MCV: 96.8 fL (ref 80.0–100.0)
Platelets: 278 10*3/uL (ref 150–400)
RBC: 4.39 MIL/uL (ref 3.87–5.11)
RDW: 13.7 % (ref 11.5–15.5)
WBC: 14.7 10*3/uL — ABNORMAL HIGH (ref 4.0–10.5)
nRBC: 0 % (ref 0.0–0.2)

## 2019-01-04 MED ORDER — IBUPROFEN 600 MG PO TABS
600.0000 mg | ORAL_TABLET | Freq: Four times a day (QID) | ORAL | Status: DC | PRN
  Administered 2019-01-04: 600 mg via ORAL
  Filled 2019-01-04: qty 1

## 2019-01-04 MED ORDER — OXYCODONE HCL 5 MG PO TABS: 5.0000 mg | ORAL_TABLET | ORAL | 0 refills | Status: DC | PRN

## 2019-01-04 MED ORDER — KETOROLAC TROMETHAMINE 30 MG/ML IJ SOLN
INTRAMUSCULAR | Status: AC
  Filled 2019-01-04: qty 1

## 2019-01-04 MED ORDER — IBUPROFEN 600 MG PO TABS: 600.0000 mg | ORAL_TABLET | Freq: Four times a day (QID) | ORAL | 1 refills | Status: DC | PRN

## 2019-01-04 MED ORDER — IBUPROFEN 200 MG PO TABS
ORAL_TABLET | ORAL | Status: AC
  Filled 2019-01-04: qty 3

## 2019-01-04 NOTE — Progress Notes (Signed)
RN removed patient's foley and vaginal packing at 0515am. Due to void.

## 2019-01-04 NOTE — Discharge Summary (Signed)
Physician Discharge Summary  Patient ID: Alexandria Hudson MRN: [id number] DOB/AGE: Sep 28, 1946 73 y.o.  Admit date: 01/03/2019 Discharge date: 01/04/2019  Admission Diagnoses:  Pelvic organ prolapse  Discharge Diagnoses:  SAA Active Problems:   Prolapse of female pelvic organs   Pelvic organ prolapse quantification stage 3 rectocele   Discharged Condition: good  Hospital Course: Patient was admitted for anticipate procedure: anterior/posterior colporrhaphy and sacrospinous ligament fixation.  The procedure was performed without complication.  Please see operative note for details.  Postoperatively, the patient was meeting all goals.  She was ambulating well, voiding without difficulty, tolerating full diet, adequate pain control.  She was discharged home on POD#1 with planned office f/u in 2 weeks.    Consults: None  Significant Diagnostic Studies: n/a  Treatments: surgery: anterior and posterior colporrhaphy and sacrospinous ligament fixation  Discharge Exam: Blood pressure 130/81, pulse 66, temperature 98.1 F (36.7 C), resp. rate 20, height 5\' 4"  (1.626 m), weight 62.6 kg, SpO2 98 %. General appearance: alert, cooperative and appears stated age GI: soft, non-tender; bowel sounds normal; no masses,  no organomegaly Extremities: extremities normal, atraumatic, no cyanosis or edema  Disposition: Discharge disposition: 01-Home or Self Care       Discharge Instructions    Discharge patient   Complete by:  As directed    Discharge disposition:  01-Home or Self Care   Discharge patient date:  01/04/2019     Allergies as of 01/04/2019      Reactions   Povidone-iodine    REACTION: skin irritation   Tylenol [acetaminophen] Palpitations      Medication List    TAKE these medications   CoQ10 100 MG Caps Take by mouth.   ibuprofen 600 MG tablet Commonly known as:  ADVIL Take 1 tablet (600 mg total) by mouth every 6 (six) hours as needed. What changed:    medication  strength  how much to take   Magnesium 100 MG Tabs Take by mouth as needed.   milk thistle 175 MG tablet Take 175 mg by mouth as needed.   NON FORMULARY OSTEO/DENX 1 TAB DAY HAS GLUCOSAMIN AND CHONDRITIN   omeprazole 40 MG capsule Commonly known as:  PRILOSEC Take 40 mg by mouth daily.   OVER THE COUNTER MEDICATION CALCIUM 300 MG DAILY   OVER THE COUNTER MEDICATION ASEA LIQUID 2 OUNCES PER DAY   oxyCODONE 5 MG immediate release tablet Commonly known as:  Oxy IR/ROXICODONE Take 1-2 tablets (5-10 mg total) by mouth every 4 (four) hours as needed for moderate pain.   UNABLE TO FIND VITAMIN D 3 2000 UNITS TAKES 2 CAPS 5 X WEEK   UNABLE TO FIND CORTISONE CREAM 2.5% PRN   vitamin k 100 MCG tablet Take 100 mcg by mouth daily. 2 TABS 5 DAYS WEEK        Signed: Linda Hedges 01/04/2019, 7:52 AM

## 2019-01-04 NOTE — Discharge Instructions (Signed)
Anterior and Posterior Colporrhaphy, Care After This sheet gives you information about how to care for yourself after your procedure. Your health care provider may also give you more specific instructions. If you have problems or questions, contact your health care provider. What can I expect after the procedure? After the procedure, it is common to have:  Pain in the surgical area.  Vaginal spotting and discharge. You will need to use a sanitary pad during this time.  Fatigue. Follow these instructions at home:  Incision care   Follow instructions from your health care provider about how to take care of your incision. Make sure you: ? Wash your hands with soap and water before touching the incision area. If soap and water are not available, use hand sanitizer. ? Clean your incision as told by your health care provider. ? Leave stitches (sutures), skin glue, or adhesive strips in place. These skin closures may need to stay in place for 2 weeks or longer. If adhesive strip edges start to loosen and curl up, you may trim the loose edges. Do not remove adhesive strips completely unless your health care provider tells you to do that.  Check your incision area every day for signs of infection. Check for: ? Redness, swelling, or pain. ? Fluid or blood. ? Warmth. ? Pus or a bad smell.  Check your incision every day to make sure the incision area is not separating or opening up.  Do not take baths, swim, or use a hot tub until your health care provider approves. You may shower.  Keep the area between your vagina and rectum (perineal area) clean and dry. Make sure you clean the area after every bowel movement and each time you urinate.  Ask your health care provider if you can take a sitz bath or sit in a tub of clean, warm water. Activity  Take frequent, short walks followed by rest periods throughout the day.  Avoid activities that take a lot of effort (are strenuous).  Do not lift  anything that is heavier than 10 lb (4.5 kg), or the limit that your health care provider tells you, until he or she says that it is safe. Avoid pushing or pulling motions.  Avoid standing for long periods of time.  Do not douche, use tampons, or have sex until your health care provider says it is okay.  Return to your normal activities as told by your health care provider. Ask your health care provider what activities are safe for you.  Do not drive until your health care provider approves. To prevent constipation To prevent or treat constipation while you are taking prescription pain medicine, your health care provider may recommend that you:  Take over-the-counter or prescription medicines.  Eat foods that are high in fiber, such as fresh fruits and vegetables, whole grains, and beans.  Drink enough fluid to keep your urine clear or pale yellow.  Limit foods that are high in fat and processed sugars, such as fried and sweet foods. General instructions  Take over-the-counter and prescription medicines only as told by your health care provider.  You may be instructed to do pelvic floor exercises (kegels) as told by your health care provider.  Do not drive or use heavy machinery while taking prescription pain medicine.  Wear compression stockings as told by your health care provider. These stockings help to prevent blood clots and reduce swelling in your legs.  Keep all follow-up visits as told by your health care provider.  This is important. Contact a health care provider if:  Medicine does not help your pain.  You have frequent or urgent urination, or you are unable to completely empty your bladder.  You feel a burning sensation when urinating.  You have pus or a bad smell coming from your vaginal area.  You have redness, swelling, or increasing pain in the vaginal area. Get help right away if:  You have increased bleeding from the vaginal area.  You cannot  urinate.  You have a fever or chills.  Your incision separates or opens.  You have trouble breathing. Summary  After the procedure, it is common to have pain, fatigue, spotting, and discharge from the vagina.  Keep the area between your vagina and rectum (perineal area) clean and dry. Make sure you clean the area after every bowel movement and each time you urinate.  Follow instructions from your health care provider about any activity restrictions after the procedure. This information is not intended to replace advice given to you by your health care provider. Make sure you discuss any questions you have with your health care provider. Document Released: 05/15/2005 Document Revised: 12/04/2016 Document Reviewed: 12/04/2016 Elsevier Interactive Patient Education  2019 [employer]. Call MD for T>100.4, heavy vaginal bleeding, severe abdominal pain, or respiratory distress.  Call office to schedule postop check in 2 weeks.  No driving while taking narcotics.  Pelvic rest x 6 weeks.  No lifting greater than 5 lbs.

## 2019-01-04 NOTE — Progress Notes (Signed)
Pt voided 100 cc yellow urine, bladder was scanned with bladder saying 48 cc next was 53 cc.

## 2019-01-06 ENCOUNTER — Inpatient Hospital Stay (HOSPITAL_COMMUNITY)
Admission: AD | Admit: 2019-01-06 | Discharge: 2019-01-07 | Disposition: A | Discharge status: Home / Self Care | Payer: Medicare Other | Attending: Emergency Medicine | Admitting: Emergency Medicine

## 2019-01-06 ENCOUNTER — Encounter (HOSPITAL_COMMUNITY): Payer: Self-pay | Admitting: Emergency Medicine

## 2019-01-06 DIAGNOSIS — R338 Other retention of urine: Secondary | ICD-10-CM

## 2019-01-06 DIAGNOSIS — R339 Retention of urine, unspecified: Secondary | ICD-10-CM | POA: Insufficient documentation

## 2019-01-06 DIAGNOSIS — Z79899 Other long term (current) drug therapy: Secondary | ICD-10-CM | POA: Insufficient documentation

## 2019-01-06 DIAGNOSIS — N9989 Other postprocedural complications and disorders of genitourinary system: Secondary | ICD-10-CM

## 2019-01-06 DIAGNOSIS — Z9889 Other specified postprocedural states: Secondary | ICD-10-CM | POA: Insufficient documentation

## 2019-01-06 MED ORDER — MORPHINE SULFATE (PF) 4 MG/ML IV SOLN
4.0000 mg | Freq: Once | INTRAVENOUS | Status: AC
  Administered 2019-01-07: 4 mg via INTRAVENOUS
  Filled 2019-01-06: qty 1

## 2019-01-06 NOTE — ED Triage Notes (Addendum)
  Patient comes in with difficulty urinating.  Patient had pelvic wall surgery on Monday for bladder prolapse.  Patient states she had a painful bowel movement this afternoon and has not been able to urinate since.  Patient has not been able to void since 1300 this afternoon.  Patient states she feels pressure in her rectum.  Pain 8/10 when ambulating.

## 2019-01-06 NOTE — ED Notes (Signed)
  Bladder scan done in triage and showed >850 ml of urine.

## 2019-01-06 NOTE — ED Provider Notes (Signed)
[facility] EMERGENCY DEPARTMENT Provider Note   CSN: [id number] Arrival date & time: 01/06/19  2135    History   Chief Complaint Chief Complaint  Patient presents with  . Post-op Problem  . Urinary Retention    HPI Alexandria Hudson is a 73 y.o. female with a history of pelvic organ prolapse with a stage III rectocele s/p anterior and posterior colporrhaphy and sacrospinous ligament fixation presents to the emergency department with a chief complaint of urinary retention.  The patient has been unable to void since 1300.  She reports she had been feeling pressure in her rectum this morning and had a painful bowel movement.  She has been unable to void since the bowel movement.  No history of urinary retention.  No recent medication changes.  She is concerned the rectal pain may be secondary to her external hemorrhoids flaring up.  She underwent anterior and posterior wall surgery for pelvic prolapse on 02/24.  She has had no difficulty voiding over the last 3 days.    She denies fever, chills, hematuria, diarrhea, nausea, vomiting, vaginal bleeding, vaginal discharge, chest pain, shortness of breath, or leg swelling.   OBGYN: Dr. Lynnette Caffey     The history is provided by the patient. No language interpreter was used.    Past Medical History:  Diagnosis Date  . Carpal tunnel syndrome    BOTH WRISTS   . Cataracts, both eyes   . GERD (gastroesophageal reflux disease)   . Headache    MIGRAINES  . Osteoporosis   . PONV (postoperative nausea and vomiting)    NO NAUSEA SINCE TUBES TIED    Patient Active Problem List   Diagnosis Date Noted  . Prolapse of female pelvic organs 01/03/2019  . Pelvic organ prolapse quantification stage 3 rectocele 01/03/2019    Past Surgical History:  Procedure Laterality Date  . ABDOMINAL HYSTERECTOMY  38 YRS AGO   PARTIAL  . ANTERIOR AND POSTERIOR REPAIR WITH SACROSPINOUS FIXATION N/A 01/03/2019   Procedure: ANTERIOR AND  POSTERIOR REPAIR WITH SACROSPINOUS FIXATION;  Surgeon: Linda Hedges, DO;  Location: [place];  Service: Gynecology;  Laterality: N/A;  . CHOLECYSTECTOMY  1986  . HERNIA REPAIR  1993   INGUINAL  . SKIN CANCER AREAS REMOVED    . TONSILLECTOMY    . TUBAL LIGATION       OB History   No obstetric history on file.      Home Medications    Prior to Admission medications   Medication Sig Start Date End Date Taking? Authorizing Provider  Coenzyme Q10 (COQ10) 100 MG CAPS Take by mouth.    [provider]  ibuprofen (ADVIL,MOTRIN) 600 MG tablet Take 1 tablet (600 mg total) by mouth every 6 (six) hours as needed. 01/04/19   Morris, Megan, DO  lidocaine (XYLOCAINE) 2 % jelly Apply 1 application topically as needed. 01/07/19   Remington Skalsky A, PA-C  Magnesium 100 MG TABS Take by mouth as needed.    [provider]  milk thistle 175 MG tablet Take 175 mg by mouth as needed.    [provider]  NON FORMULARY OSTEO/DENX 1 TAB DAY HAS GLUCOSAMIN AND CHONDRITIN    [provider]  omeprazole (PRILOSEC) 40 MG capsule Take 40 mg by mouth daily.    [provider]  OVER THE COUNTER MEDICATION CALCIUM 300 MG DAILY    [provider]  OVER THE COUNTER MEDICATION ASEA LIQUID 2 OUNCES PER DAY  [provider]  oxyCODONE (OXY IR/ROXICODONE) 5 MG immediate release tablet Take 1-2 tablets (5-10 mg total) by mouth every 4 (four) hours as needed for moderate pain. 01/04/19   Morris, Megan, DO  UNABLE TO FIND VITAMIN D 3 2000 UNITS TAKES 2 CAPS 5 X WEEK    [provider]  UNABLE TO FIND CORTISONE CREAM 2.5% PRN    [provider]  vitamin k 100 MCG tablet Take 100 mcg by mouth daily. 2 TABS 5 DAYS WEEK    [provider]    Family History History reviewed. No pertinent family history.  Social History Social History   Tobacco Use  . Smoking status: Never Smoker  . Smokeless tobacco: Never Used    Substance Use Topics  . Alcohol use: Not on file    Comment: OCC  . Drug use: Never     Allergies   Povidone-iodine and Tylenol [acetaminophen]   Review of Systems Review of Systems  Constitutional: Negative for activity change, chills and fever.  Respiratory: Negative for shortness of breath.   Cardiovascular: Negative for chest pain.  Gastrointestinal: Positive for abdominal pain, constipation and rectal pain. Negative for abdominal distention, diarrhea, nausea and vomiting.  Genitourinary: Positive for difficulty urinating. Negative for dysuria, hematuria, vaginal bleeding, vaginal discharge and vaginal pain.  Musculoskeletal: Negative for back pain.  Skin: Negative for rash.  Allergic/Immunologic: Negative for immunocompromised state.  Neurological: Negative for dizziness, seizures, weakness and headaches.  Psychiatric/Behavioral: Negative for confusion.   Physical Exam Updated Vital Signs BP 115/61   Pulse 72   Temp 98.5 F (36.9 C)   Resp 16   Ht 5\' 4"  (1.626 m)   Wt 62.6 kg   SpO2 92%   BMI 23.69 kg/m   Physical Exam Vitals signs and nursing note reviewed.  Constitutional:      General: She is not in acute distress. HENT:     Head: Normocephalic.  Eyes:     General: No scleral icterus.    Conjunctiva/sclera: Conjunctivae normal.     Pupils: Pupils are equal, round, and reactive to light.  Neck:     Musculoskeletal: Normal range of motion and neck supple. No neck rigidity or muscular tenderness.     Vascular: No carotid bruit.  Cardiovascular:     Rate and Rhythm: Normal rate and regular rhythm.     Heart sounds: No murmur. No friction rub. No gallop.   Pulmonary:     Effort: Pulmonary effort is normal. No respiratory distress.     Breath sounds: No stridor. No wheezing, rhonchi or rales.  Chest:     Chest wall: No tenderness.  Abdominal:     General: There is distension.     Palpations: Abdomen is soft. There is no mass.     Tenderness: There is  abdominal tenderness. There is no right CVA tenderness, left CVA tenderness, guarding or rebound.     Hernia: No hernia is present.     Comments: Tender to palpation to the bilateral lower abdomen without rebound or guarding.  Abdomen is soft, but mildly distended.  Genitourinary:    Comments: Chaperoned exam.  There is significant edema to the labia bilaterally.  Urethra is erythematous and edematous. Rectal exam with 3 external hemorrhoids.  Tender to palpation in the 12:00 area with no fissures, abscesses, or skin tears. Musculoskeletal:     Right lower leg: No edema.     Left lower leg: No edema.  Lymphadenopathy:  Cervical: No cervical adenopathy.  Skin:    General: Skin is warm.     Capillary Refill: Capillary refill takes less than 2 seconds.     Findings: No rash.  Neurological:     Mental Status: She is alert.  Psychiatric:        Behavior: Behavior normal.      ED Treatments / Results  Labs (all labs ordered are listed, but only abnormal results are displayed) Labs Reviewed  CBC - Abnormal; Notable for the following components:      Result Value   WBC 13.9 (*)    All other components within normal limits  BASIC METABOLIC PANEL - Abnormal; Notable for the following components:   Glucose, Bld 129 (*)    All other components within normal limits  URINALYSIS, ROUTINE W REFLEX MICROSCOPIC - Abnormal; Notable for the following components:   Color, Urine AMBER (*)    Ketones, ur 5 (*)    Nitrite POSITIVE (*)    All other components within normal limits  URINE CULTURE    EKG None  Radiology No results found.  Procedures BLADDER CATHETERIZATION Date/Time: 01/07/2019 12:45 AM Performed by: Joanne Gavel, PA-C Authorized by: Joanne Gavel, PA-C   Consent:    Consent obtained:  Verbal   Consent given by:  Patient   Risks discussed:  Infection, urethral injury and pain   Alternatives discussed:  No treatment Pre-procedure details:    Procedure purpose:   Therapeutic Anesthesia (see MAR for exact dosages):    Anesthesia method:  None Procedure details:    Provider performed due to:  Nurse unable to complete and altered anatomy   Altered anatomy:  Edema   Catheter insertion:  Indwelling   Catheter type:  Foley   Catheter size:  14 Fr   Number of attempts:  1   Urine characteristics:  Yellow Post-procedure details:    Patient tolerance of procedure:  Tolerated well, no immediate complications   (including critical care time)  Medications Ordered in ED Medications  morphine 4 MG/ML injection 4 mg (4 mg Intravenous Given 01/07/19 0000)     Initial Impression / Assessment and Plan / ED Course  I have reviewed the triage vital signs and the nursing notes.  Pertinent labs & imaging results that were available during my care of the patient were reviewed by me and considered in my medical decision making (see chart for details).        73 year old female with a history of pelvic organ prolapse with a stage III rectocele s/p anterior and posterior colporrhaphy and sacrospinous ligament fixation presenting after anterior and posterior colporrhaphy and sacrospinous ligament fixation on 2/24.  She has had urinary retention since 1300.  Bladder scan with greater than 850 mL's of urine.  Labs are reassuring. Approach by nursing staff that ED tech and RN had attempted Foley catheter placement and they were concerned about trauma because the Foley returned blood and they were unable to place the Foley in the urethra.  Catheter placement was attempted by me with Dr. Mariane Baumgarten, attending physician, assisting.  Foley catheter was placed in the urethra on the first try.  No hematuria.  Greater than 1200 mL's of urine returned after Foley placement.  She reports significant improvement in her pain.  UA is more concerning for inflammation than infection.  However, will send urine culture.  Spoke with Dr. Julien Girt, OB/GYN, who recommended the patient follow-up in  the office tomorrow morning.  Patient also expressed  concerns that her hemorrhoids may be flaring up.  She was tender on rectal exam.  Will discharge with prescription for lidocaine jelly.  She is hemodynamically stable and in no acute distress.  Foley care education provided in the ER.  Safe for discharge at this time.   Final Clinical Impressions(s) / ED Diagnoses   Final diagnoses:  Postoperative urinary retention    ED Discharge Orders         Ordered    lidocaine (XYLOCAINE) 2 % jelly  As needed     01/07/19 0217           Aaliayah Miao A, PA-C 01/07/19 0755    Orpah Greek, MD 01/10/19 [phone]

## 2019-01-06 NOTE — ED Notes (Signed)
ED Provider at bedside. 

## 2019-01-06 NOTE — MAU Note (Addendum)
Pt reports she had surgery on Monday for prolapsed pelvic organs and has not urinated since 2pm today. Reports she did not have any problems immediately postop or yesterday. Was able to urinate this morning. Reports constipation and rectal pain. Is on stool softener.

## 2019-01-06 NOTE — MAU Provider Note (Signed)
Chief Complaint:  Post-op Problem  Provider saw patient at 2210hrs  HPI: Alexandria Hudson is a 73 y.o. who presents to maternity admissions reporting inability to urinate since 1400hrs today.  Was able to urinate the past two days and this morning.  Had a BM and it was after that she was unable to urinate.  Has tried several times.     Is s/p surgery for prolapse on 01/03/2019.  States Dr Julien Girt told her to come here, though we do not see nonpregnant patients here.  I will do a medical screening exam and transfer to ED. Denies significant pain or bleeding.       Past Medical History: Past Medical History:  Diagnosis Date  . Carpal tunnel syndrome    BOTH WRISTS   . Cataracts, both eyes   . GERD (gastroesophageal reflux disease)   . Headache    MIGRAINES  . Osteoporosis   . PONV (postoperative nausea and vomiting)    NO NAUSEA SINCE TUBES TIED    Past obstetric history: OB History  No obstetric history on file.    Past Surgical History: Past Surgical History:  Procedure Laterality Date  . ABDOMINAL HYSTERECTOMY  38 YRS AGO   PARTIAL  . ANTERIOR AND POSTERIOR REPAIR WITH SACROSPINOUS FIXATION N/A 01/03/2019   Procedure: ANTERIOR AND POSTERIOR REPAIR WITH SACROSPINOUS FIXATION;  Surgeon: Linda Hedges, DO;  Location: [place];  Service: Gynecology;  Laterality: N/A;  . CHOLECYSTECTOMY  1986  . HERNIA REPAIR  1993   INGUINAL  . SKIN CANCER AREAS REMOVED    . TONSILLECTOMY    . TUBAL LIGATION      Family History: No family history on file.  Social History: Social History   Tobacco Use  . Smoking status: Never Smoker  . Smokeless tobacco: Never Used  Substance Use Topics  . Alcohol use: Not on file    Comment: OCC  . Drug use: Never    Allergies:  Allergies  Allergen Reactions  . Povidone-Iodine     REACTION: skin irritation  . Tylenol [Acetaminophen] Palpitations    Meds:  Medications Prior to Admission  Medication Sig Dispense Refill  Last Dose  . Coenzyme Q10 (COQ10) 100 MG CAPS Take by mouth.   01/02/2019 at Unknown time  . ibuprofen (ADVIL,MOTRIN) 600 MG tablet Take 1 tablet (600 mg total) by mouth every 6 (six) hours as needed. 30 tablet 1   . Magnesium 100 MG TABS Take by mouth as needed.   01/02/2019 at Unknown time  . milk thistle 175 MG tablet Take 175 mg by mouth as needed.   01/02/2019 at Unknown time  . NON FORMULARY OSTEO/DENX 1 TAB DAY HAS GLUCOSAMIN AND CHONDRITIN   01/02/2019 at Unknown time  . omeprazole (PRILOSEC) 40 MG capsule Take 40 mg by mouth daily.   01/02/2019 at Unknown time  . OVER THE COUNTER MEDICATION CALCIUM 300 MG DAILY   01/02/2019 at Unknown time  . OVER THE COUNTER MEDICATION ASEA LIQUID 2 OUNCES PER DAY   01/02/2019 at Unknown time  . oxyCODONE (OXY IR/ROXICODONE) 5 MG immediate release tablet Take 1-2 tablets (5-10 mg total) by mouth every 4 (four) hours as needed for moderate pain. 20 tablet 0   . UNABLE TO FIND VITAMIN D 3 2000 UNITS TAKES 2 CAPS 5 X WEEK   01/02/2019 at Unknown time  . UNABLE TO FIND CORTISONE CREAM 2.5% PRN   01/02/2019 at Unknown time  . vitamin k 100 MCG tablet  Take 100 mcg by mouth daily. 2 TABS 5 DAYS WEEK   01/02/2019 at Unknown time    I have reviewed patient's Past Medical Hx, Surgical Hx, Family Hx, Social Hx, medications and allergies.  ROS:  Review of Systems  Constitutional: Negative for chills and fever.  Respiratory: Negative for shortness of breath.   Cardiovascular: Negative for chest pain.  Gastrointestinal: Negative for abdominal pain.  Genitourinary: Positive for difficulty urinating.     Physical Exam   Patient Vitals for the past 24 hrs:  BP Temp Pulse Resp SpO2 Height Weight  01/06/19 2201 138/75 98.5 F (36.9 C) 83 18 100 % - -  01/06/19 2200 - - - - - 5\' 4"  (1.626 m) 62.6 kg   Constitutional: Well-developed, well-nourished female in no acute distress.  Cardiovascular: normal rate and rhythm Respiratory: normal effort, no distress Neurologic:  Alert and oriented x 4.   Grossly nonfocal. Psych:  Affect appropriate.  Labs: No results found for this or any previous visit (from the past 24 hour(s)). --/--/O POS (02/19 1337)  Imaging:  No results found.  MAU Course/MDM: MSE completed Transfer to [facility] ED per Dr Ihor Dow  Assessment: Postoperative Day # 3 Urinary Retention  Plan: Transfer to ED  Hansel Feinstein CNM, MSN Certified Nurse-Midwife 01/06/2019 10:15 PM

## 2019-01-07 LAB — BASIC METABOLIC PANEL
Anion gap: 8 (ref 5–15)
BUN: 10 mg/dL (ref 8–23)
CO2: 24 mmol/L (ref 22–32)
Calcium: 10 mg/dL (ref 8.9–10.3)
Chloride: 103 mmol/L (ref 98–111)
Creatinine, Ser: 0.61 mg/dL (ref 0.44–1.00)
GFR calc Af Amer: >60 mL/min (ref >60)
GFR calc non Af Amer: >60 mL/min (ref >60)
Glucose, Bld: 129 mg/dL — ABNORMAL HIGH (ref 70–99)
Potassium: 4.1 mmol/L (ref 3.5–5.1)
Sodium: 135 mmol/L (ref 135–145)

## 2019-01-07 LAB — URINALYSIS, ROUTINE W REFLEX MICROSCOPIC
Bacteria, UA: NONE SEEN
Bilirubin Urine: NEGATIVE
Glucose, UA: NEGATIVE mg/dL
Hgb urine dipstick: NEGATIVE
Ketones, ur: 5 mg/dL — AB
Leukocytes,Ua: NEGATIVE
Nitrite: POSITIVE — AB
Protein, ur: NEGATIVE mg/dL
Specific Gravity, Urine: 1.008 (ref 1.005–1.030)
pH: 7 (ref 5.0–8.0)

## 2019-01-07 LAB — CBC
HCT: 38.3 % (ref 36.0–46.0)
Hemoglobin: 12.2 g/dL (ref 12.0–15.0)
MCH: 29.1 pg (ref 26.0–34.0)
MCHC: 31.9 g/dL (ref 30.0–36.0)
MCV: 91.4 fL (ref 80.0–100.0)
Platelets: 279 10*3/uL (ref 150–400)
RBC: 4.19 MIL/uL (ref 3.87–5.11)
RDW: 13.2 % (ref 11.5–15.5)
WBC: 13.9 10*3/uL — ABNORMAL HIGH (ref 4.0–10.5)
nRBC: 0 % (ref 0.0–0.2)

## 2019-01-07 MED ORDER — LIDOCAINE HCL 2 % EX GEL: 1.0000 "application " | CUTANEOUS | 0 refills | Status: DC | PRN

## 2019-01-07 NOTE — ED Notes (Signed)
Reviewed d/c instructions with pt, who verbalized understanding and had no outstanding questions. Pt departed in NAD.   

## 2019-01-07 NOTE — ED Notes (Signed)
ED Provider at bedside. 

## 2019-01-07 NOTE — Discharge Instructions (Addendum)
Thank you for allowing me to care for you today in the Emergency Department.   Keep your Foley in place until you have been seen by OBGYN and they advise you to remove it.  Continue your home pain regimen of ibuprofen to help with swelling.  You can apply a thin layer of lidocaine jelly to your hemorrhoids once every 12 hours.  Return to the emergency department if you pass a large amount of blood in the Foley, if the Foley stops draining, if you develop a high fever, severe, uncontrollable abdominal pain, or other new, concerning symptoms.

## 2019-01-08 LAB — URINE CULTURE: Culture: NO GROWTH

## 2019-04-11 ENCOUNTER — Other Ambulatory Visit: Payer: Self-pay | Admitting: Obstetrics & Gynecology

## 2019-04-11 DIAGNOSIS — Z1231 Encounter for screening mammogram for malignant neoplasm of breast: Secondary | ICD-10-CM

## 2019-04-14 ENCOUNTER — Ambulatory Visit
Admission: RE | Admit: 2019-04-14 | Discharge: 2019-04-14 | Disposition: A | Discharge status: Home / Self Care | Payer: Medicare Other | Source: Ambulatory Visit | Attending: Obstetrics & Gynecology | Admitting: Obstetrics & Gynecology

## 2019-04-14 ENCOUNTER — Other Ambulatory Visit: Payer: Self-pay

## 2019-04-14 DIAGNOSIS — Z1231 Encounter for screening mammogram for malignant neoplasm of breast: Secondary | ICD-10-CM

## 2019-10-27 ENCOUNTER — Other Ambulatory Visit: Payer: Self-pay | Admitting: Family Medicine

## 2019-10-27 DIAGNOSIS — M810 Age-related osteoporosis without current pathological fracture: Secondary | ICD-10-CM

## 2019-12-07 ENCOUNTER — Other Ambulatory Visit: Payer: Self-pay | Admitting: Family Medicine

## 2019-12-07 DIAGNOSIS — M810 Age-related osteoporosis without current pathological fracture: Secondary | ICD-10-CM

## 2019-12-14 ENCOUNTER — Encounter: Payer: Self-pay | Admitting: Gastroenterology

## 2019-12-27 DIAGNOSIS — H6123 Impacted cerumen, bilateral: Secondary | ICD-10-CM | POA: Insufficient documentation

## 2020-01-04 ENCOUNTER — Ambulatory Visit (AMBULATORY_SURGERY_CENTER): Payer: Self-pay | Admitting: *Deleted

## 2020-01-04 ENCOUNTER — Telehealth: Payer: Self-pay | Admitting: *Deleted

## 2020-01-04 ENCOUNTER — Other Ambulatory Visit: Payer: Self-pay

## 2020-01-04 VITALS — Ht 64.0 in | Wt 132.0 lb

## 2020-01-04 DIAGNOSIS — Z01818 Encounter for other preprocedural examination: Secondary | ICD-10-CM

## 2020-01-04 DIAGNOSIS — Z1211 Encounter for screening for malignant neoplasm of colon: Secondary | ICD-10-CM

## 2020-01-04 MED ORDER — SUPREP BOWEL PREP KIT 17.5-3.13-1.6 GM/177ML PO SOLN: ORAL | 0 refills | Status: DC

## 2020-01-04 NOTE — Telephone Encounter (Signed)
Okay thanks [employer]

## 2020-01-04 NOTE — Telephone Encounter (Signed)
Dr. Havery Moros,  I did this pt's PV today- her colonoscopy is 01-18-20.   Pt had anterior and posterior repair with sacrospinous fixation.  She had issues right after procedure, but none since.  She has had no issues since.  She just me to send you a message so you're aware of this.  Thanks, [employer]

## 2020-01-04 NOTE — Progress Notes (Signed)
Pt's previsit is done over the phone and all paperwork (prep instructions, blank consent form to just read over, pre-procedure acknowledgement form and stamped envelope) sent to patient  Pt had anterior and posterior repair with sacrospinous fixation.  She had issues right after procedure, but none since.  She has had no issues since.  Note sent to Dr. Havery Moros so he is aware.  Pt is aware that care partner will wait in the car during procedure; if they feel like they will be too hot or cold to wait in the car; they may wait in the 4 th floor lobby. Patient is aware to bring only one care partner. We want them to wear a mask (we do not have any that we can provide them), practice social distancing, and we will check their temperatures when they get here.  I did remind the patient that their care partner needs to stay in the parking lot the entire time and have a cell phone available, we will call them when the pt is ready for discharge. Patient will wear mask into building.  No PONV since tubal ligation  No intubation problems or hx/fam hx of malignant hyperthermia   No egg or soy allergy  No home oxygen use   No medications for weight loss taken  emmi information given  covid test 01-13-20 at 1:00 pm

## 2020-01-13 ENCOUNTER — Ambulatory Visit (INDEPENDENT_AMBULATORY_CARE_PROVIDER_SITE_OTHER): Payer: Medicare Other

## 2020-01-13 ENCOUNTER — Other Ambulatory Visit: Payer: Self-pay | Admitting: Gastroenterology

## 2020-01-13 DIAGNOSIS — Z1159 Encounter for screening for other viral diseases: Secondary | ICD-10-CM

## 2020-01-14 LAB — SARS CORONAVIRUS 2 (TAT 6-24 HRS): SARS Coronavirus 2: NEGATIVE

## 2020-01-16 ENCOUNTER — Encounter: Payer: Self-pay | Admitting: Gastroenterology

## 2020-01-18 ENCOUNTER — Encounter: Payer: Self-pay | Admitting: Gastroenterology

## 2020-01-18 ENCOUNTER — Other Ambulatory Visit: Payer: Self-pay

## 2020-01-18 ENCOUNTER — Ambulatory Visit (AMBULATORY_SURGERY_CENTER): Payer: Medicare Other | Admitting: Gastroenterology

## 2020-01-18 VITALS — BP 129/78 | HR 77 | Temp 97.3°F | Resp 10 | Ht 64.0 in | Wt 132.0 lb

## 2020-01-18 DIAGNOSIS — K635 Polyp of colon: Secondary | ICD-10-CM

## 2020-01-18 DIAGNOSIS — Z1211 Encounter for screening for malignant neoplasm of colon: Secondary | ICD-10-CM | POA: Diagnosis not present

## 2020-01-18 DIAGNOSIS — D120 Benign neoplasm of cecum: Secondary | ICD-10-CM | POA: Diagnosis not present

## 2020-01-18 DIAGNOSIS — D123 Benign neoplasm of transverse colon: Secondary | ICD-10-CM | POA: Diagnosis not present

## 2020-01-18 DIAGNOSIS — D122 Benign neoplasm of ascending colon: Secondary | ICD-10-CM

## 2020-01-18 DIAGNOSIS — D124 Benign neoplasm of descending colon: Secondary | ICD-10-CM

## 2020-01-18 MED ORDER — SODIUM CHLORIDE 0.9 % IV SOLN: 500.0000 mL | Freq: Once | INTRAVENOUS | Status: DC

## 2020-01-18 NOTE — Op Note (Signed)
[facility] Patient Name: Alexandria Hudson Procedure Date: 01/18/2020 11:03 AM MRN: [id number] Endoscopist: Remo Lipps P. Havery Moros , MD Age: 74 Referring MD:  Date of Birth: 01-28-1946 Gender: Female Account #: [id number] Procedure:                Colonoscopy Indications:              Screening for colorectal malignant neoplasm Medicines:                Monitored Anesthesia Care Procedure:                Pre-Anesthesia Assessment:                           - Prior to the procedure, a History and Physical                            was performed, and patient medications and                            allergies were reviewed. The patient's tolerance of                            previous anesthesia was also reviewed. The risks                            and benefits of the procedure and the sedation                            options and risks were discussed with the patient.                            All questions were answered, and informed consent                            was obtained. Prior Anticoagulants: The patient has                            taken no previous anticoagulant or antiplatelet                            agents. ASA Grade Assessment: II - A patient with                            mild systemic disease. After reviewing the risks                            and benefits, the patient was deemed in                            satisfactory condition to undergo the procedure.                           After obtaining informed consent, the colonoscope  was passed under direct vision. Throughout the                            procedure, the patient's blood pressure, pulse, and                            oxygen saturations were monitored continuously. The                            Colonoscope was introduced through the anus and                            advanced to the the cecum, identified by                            appendiceal  orifice and ileocecal valve. The                            colonoscopy was performed without difficulty. The                            patient tolerated the procedure well. The quality                            of the bowel preparation was adequate. The                            ileocecal valve, appendiceal orifice, and rectum                            were photographed. Scope In: 11:07:19 AM Scope Out: 11:47:09 AM Scope Withdrawal Time: 0 hours 27 minutes 55 seconds  Total Procedure Duration: 0 hours 39 minutes 50 seconds  Findings:                 The perianal and digital rectal examinations were                            normal.                           Two flat polyps were found in the cecum. The polyps                            were 3 to 7 mm in size. These polyps were removed                            with a cold snare. Resection and retrieval were                            complete.                           Five flat and sessile polyps were found in the  ascending colon. The polyps were 3 to 8 mm in size.                            These polyps were removed with a cold snare, one of                            which could only be grasped and removed in the                            retroflexed position. Resection and retrieval were                            complete.                           Four flat polyps were found in the transverse                            colon. The polyps were 3 to 12 mm in size. These                            polyps were removed with a cold snare. Resection                            and retrieval were complete.                           Two flat polyps were found in the splenic flexure.                            The polyps were 4 to 6 mm in size. These polyps                            were removed with a cold snare. Resection and                            retrieval were complete.                           A 4  mm polyp was found in the descending colon. The                            polyp was flat. The polyp was removed with a cold                            snare. Resection and retrieval were complete.                           Many medium-mouthed diverticula were found in the                            transverse colon and left colon.  Internal hemorrhoids were found during                            retroflexion. The hemorrhoids were moderate.                           The exam was otherwise without abnormality. Complications:            No immediate complications. Estimated blood loss:                            Minimal. Estimated Blood Loss:     Estimated blood loss was minimal. Impression:               - Two 3 to 7 mm polyps in the cecum, removed with a                            cold snare. Resected and retrieved.                           - Five 3 to 8 mm polyps in the ascending colon,                            removed with a cold snare. Resected and retrieved.                           - Four 3 to 12 mm polyps in the transverse colon,                            removed with a cold snare. Resected and retrieved.                           - Two 4 to 6 mm polyps at the splenic flexure,                            removed with a cold snare. Resected and retrieved.                           - One 4 mm polyp in the descending colon, removed                            with a cold snare. Resected and retrieved.                           - Diverticulosis in the transverse colon and in the                            left colon.                           - Internal hemorrhoids.                           - The examination was otherwise normal. Recommendation:           -  Patient has a contact number available for                            emergencies. The signs and symptoms of potential                            delayed complications were discussed with the                             patient. Return to normal activities tomorrow.                            Written discharge instructions were provided to the                            patient.                           - Resume previous diet.                           - Continue present medications.                           - Await pathology results. Remo Lipps P. Noya Santarelli, MD 01/18/2020 11:53:51 AM This report has been signed electronically.

## 2020-01-18 NOTE — Patient Instructions (Signed)
Handouts on polyps, hemorrhoids, and diverticulosis given to you today  Await pathology results    YOU HAD AN ENDOSCOPIC PROCEDURE TODAY AT THE [facility]:   Refer to the procedure report that was given to you for any specific questions about what was found during the examination.  If the procedure report does not answer your questions, please call your gastroenterologist to clarify.  If you requested that your care partner not be given the details of your procedure findings, then the procedure report has been included in a sealed envelope for you to review at your convenience later.  YOU SHOULD EXPECT: Some feelings of bloating in the abdomen. Passage of more gas than usual.  Walking can help get rid of the air that was put into your GI tract during the procedure and reduce the bloating. If you had a lower endoscopy (such as a colonoscopy or flexible sigmoidoscopy) you may notice spotting of blood in your stool or on the toilet paper. If you underwent a bowel prep for your procedure, you may not have a normal bowel movement for a few days.  Please Note:  You might notice some irritation and congestion in your nose or some drainage.  This is from the oxygen used during your procedure.  There is no need for concern and it should clear up in a day or so.  SYMPTOMS TO REPORT IMMEDIATELY:   Following lower endoscopy (colonoscopy or flexible sigmoidoscopy):  Excessive amounts of blood in the stool  Significant tenderness or worsening of abdominal pains  Swelling of the abdomen that is new, acute  Fever of 100F or higher  For urgent or emergent issues, a gastroenterologist can be reached at any hour by calling [phone]. Do not use MyChart messaging for urgent concerns.    DIET:  We do recommend a small meal at first, but then you may proceed to your regular diet.  Drink plenty of fluids but you should avoid alcoholic beverages for 24 hours.  ACTIVITY:  You should plan to take  it easy for the rest of today and you should NOT DRIVE or use heavy machinery until tomorrow (because of the sedation medicines used during the test).    FOLLOW UP: Our staff will call the number listed on your records 48-72 hours following your procedure to check on you and address any questions or concerns that you may have regarding the information given to you following your procedure. If we do not reach you, we will leave a message.  We will attempt to reach you two times.  During this call, we will ask if you have developed any symptoms of COVID 19. If you develop any symptoms (ie: fever, flu-like symptoms, shortness of breath, cough etc.) before then, please call [phone].  If you test positive for Covid 19 in the 2 weeks post procedure, please call and report this information to us.    If any biopsies were taken you will be contacted by phone or by letter within the next 1-3 weeks.  Please call us at [phone] if you have not heard about the biopsies in 3 weeks.    SIGNATURES/CONFIDENTIALITY: You and/or your care partner have signed paperwork which will be entered into your electronic medical record.  These signatures attest to the fact that that the information above on your After Visit Summary has been reviewed and is understood.  Full responsibility of the confidentiality of this discharge information lies with you and/or your care-partner. 

## 2020-01-18 NOTE — Progress Notes (Signed)
Called to room to assist during endoscopic procedure.  Patient ID and intended procedure confirmed with present staff. Received instructions for my participation in the procedure from the performing physician.  

## 2020-01-18 NOTE — Progress Notes (Signed)
Pt's states no medical or surgical changes since previsit or office visit.  JB - temp CW - vitals. 

## 2020-01-18 NOTE — Progress Notes (Signed)
Report given to PACU, vss 

## 2020-01-20 ENCOUNTER — Telehealth: Payer: Self-pay | Admitting: *Deleted

## 2020-01-20 NOTE — Telephone Encounter (Signed)
  Follow up Call-  Call back number 01/18/2020  Post procedure Call Back phone  # [phone]  Permission to leave phone message Yes  Some recent data might be hidden     Patient questions:  Do you have a fever, pain , or abdominal swelling? No. Pain Score  0 *  Have you tolerated food without any problems? Yes.    Have you been able to return to your normal activities? Yes.    Do you have any questions about your discharge instructions: Diet   No. Medications  No. Follow up visit  No.  Do you have questions or concerns about your Care? No.  Actions: * If pain score is 4 or above: No action needed, pain <4.  1. Have you developed a fever since your procedure? no  2.   Have you had an respiratory symptoms (SOB or cough) since your procedure? no  3.   Have you tested positive for COVID 19 since your procedure no  4.   Have you had any family members/close contacts diagnosed with the COVID 19 since your procedure?  no   If yes to any of these questions please route to Joylene John, RN and Alphonsa Gin, Therapist, sports.

## 2020-01-24 ENCOUNTER — Encounter: Payer: Self-pay | Admitting: Gastroenterology

## 2020-02-21 ENCOUNTER — Other Ambulatory Visit: Payer: Self-pay

## 2020-02-21 ENCOUNTER — Ambulatory Visit
Admission: RE | Admit: 2020-02-21 | Discharge: 2020-02-21 | Disposition: A | Discharge status: Home / Self Care | Payer: Medicare Other | Source: Ambulatory Visit | Attending: Family Medicine | Admitting: Family Medicine

## 2020-02-21 DIAGNOSIS — M810 Age-related osteoporosis without current pathological fracture: Secondary | ICD-10-CM

## 2020-06-15 ENCOUNTER — Other Ambulatory Visit: Payer: Self-pay | Admitting: Family Medicine

## 2020-06-15 DIAGNOSIS — Z1231 Encounter for screening mammogram for malignant neoplasm of breast: Secondary | ICD-10-CM

## 2020-07-03 ENCOUNTER — Other Ambulatory Visit: Payer: Self-pay

## 2020-07-03 ENCOUNTER — Ambulatory Visit
Admission: RE | Admit: 2020-07-03 | Discharge: 2020-07-03 | Disposition: A | Discharge status: Home / Self Care | Payer: Medicare Other | Source: Ambulatory Visit | Attending: Family Medicine | Admitting: Family Medicine

## 2020-07-03 DIAGNOSIS — Z1231 Encounter for screening mammogram for malignant neoplasm of breast: Secondary | ICD-10-CM

## 2020-11-14 DIAGNOSIS — M25661 Stiffness of right knee, not elsewhere classified: Secondary | ICD-10-CM | POA: Diagnosis not present

## 2020-11-14 DIAGNOSIS — M25561 Pain in right knee: Secondary | ICD-10-CM | POA: Diagnosis not present

## 2020-11-19 DIAGNOSIS — M25561 Pain in right knee: Secondary | ICD-10-CM | POA: Diagnosis not present

## 2020-11-19 DIAGNOSIS — M25661 Stiffness of right knee, not elsewhere classified: Secondary | ICD-10-CM | POA: Diagnosis not present

## 2020-11-20 DIAGNOSIS — K219 Gastro-esophageal reflux disease without esophagitis: Secondary | ICD-10-CM | POA: Diagnosis not present

## 2020-11-20 DIAGNOSIS — E7800 Pure hypercholesterolemia, unspecified: Secondary | ICD-10-CM | POA: Diagnosis not present

## 2020-11-20 DIAGNOSIS — Z7189 Other specified counseling: Secondary | ICD-10-CM | POA: Diagnosis not present

## 2020-11-20 DIAGNOSIS — M810 Age-related osteoporosis without current pathological fracture: Secondary | ICD-10-CM | POA: Diagnosis not present

## 2020-11-20 DIAGNOSIS — G5601 Carpal tunnel syndrome, right upper limb: Secondary | ICD-10-CM | POA: Diagnosis not present

## 2020-11-20 DIAGNOSIS — Z0000 Encounter for general adult medical examination without abnormal findings: Secondary | ICD-10-CM | POA: Diagnosis not present

## 2020-11-20 DIAGNOSIS — E559 Vitamin D deficiency, unspecified: Secondary | ICD-10-CM | POA: Diagnosis not present

## 2020-11-22 DIAGNOSIS — M25661 Stiffness of right knee, not elsewhere classified: Secondary | ICD-10-CM | POA: Diagnosis not present

## 2020-11-22 DIAGNOSIS — M25561 Pain in right knee: Secondary | ICD-10-CM | POA: Diagnosis not present

## 2020-11-26 DIAGNOSIS — M25561 Pain in right knee: Secondary | ICD-10-CM | POA: Diagnosis not present

## 2020-11-26 DIAGNOSIS — M25661 Stiffness of right knee, not elsewhere classified: Secondary | ICD-10-CM | POA: Diagnosis not present

## 2020-11-28 DIAGNOSIS — E559 Vitamin D deficiency, unspecified: Secondary | ICD-10-CM | POA: Diagnosis not present

## 2020-11-28 DIAGNOSIS — E7800 Pure hypercholesterolemia, unspecified: Secondary | ICD-10-CM | POA: Diagnosis not present

## 2020-11-29 DIAGNOSIS — M25561 Pain in right knee: Secondary | ICD-10-CM | POA: Diagnosis not present

## 2020-11-29 DIAGNOSIS — M25661 Stiffness of right knee, not elsewhere classified: Secondary | ICD-10-CM | POA: Diagnosis not present

## 2020-12-04 DIAGNOSIS — M25561 Pain in right knee: Secondary | ICD-10-CM | POA: Diagnosis not present

## 2020-12-04 DIAGNOSIS — M25661 Stiffness of right knee, not elsewhere classified: Secondary | ICD-10-CM | POA: Diagnosis not present

## 2020-12-06 DIAGNOSIS — M25661 Stiffness of right knee, not elsewhere classified: Secondary | ICD-10-CM | POA: Diagnosis not present

## 2020-12-06 DIAGNOSIS — M25561 Pain in right knee: Secondary | ICD-10-CM | POA: Diagnosis not present

## 2020-12-11 DIAGNOSIS — M25561 Pain in right knee: Secondary | ICD-10-CM | POA: Diagnosis not present

## 2020-12-11 DIAGNOSIS — M25661 Stiffness of right knee, not elsewhere classified: Secondary | ICD-10-CM | POA: Diagnosis not present

## 2020-12-13 DIAGNOSIS — M25561 Pain in right knee: Secondary | ICD-10-CM | POA: Diagnosis not present

## 2020-12-13 DIAGNOSIS — M25661 Stiffness of right knee, not elsewhere classified: Secondary | ICD-10-CM | POA: Diagnosis not present

## 2020-12-19 DIAGNOSIS — M25561 Pain in right knee: Secondary | ICD-10-CM | POA: Diagnosis not present

## 2020-12-19 DIAGNOSIS — M25661 Stiffness of right knee, not elsewhere classified: Secondary | ICD-10-CM | POA: Diagnosis not present

## 2020-12-20 DIAGNOSIS — M25661 Stiffness of right knee, not elsewhere classified: Secondary | ICD-10-CM | POA: Diagnosis not present

## 2020-12-20 DIAGNOSIS — M25561 Pain in right knee: Secondary | ICD-10-CM | POA: Diagnosis not present

## 2020-12-25 DIAGNOSIS — M25561 Pain in right knee: Secondary | ICD-10-CM | POA: Diagnosis not present

## 2020-12-25 DIAGNOSIS — M25661 Stiffness of right knee, not elsewhere classified: Secondary | ICD-10-CM | POA: Diagnosis not present

## 2020-12-27 DIAGNOSIS — M25661 Stiffness of right knee, not elsewhere classified: Secondary | ICD-10-CM | POA: Diagnosis not present

## 2020-12-27 DIAGNOSIS — M25561 Pain in right knee: Secondary | ICD-10-CM | POA: Diagnosis not present

## 2020-12-31 ENCOUNTER — Encounter: Payer: Self-pay | Admitting: General Practice

## 2021-01-02 DIAGNOSIS — M25661 Stiffness of right knee, not elsewhere classified: Secondary | ICD-10-CM | POA: Diagnosis not present

## 2021-01-02 DIAGNOSIS — M25561 Pain in right knee: Secondary | ICD-10-CM | POA: Diagnosis not present

## 2021-01-04 DIAGNOSIS — M25661 Stiffness of right knee, not elsewhere classified: Secondary | ICD-10-CM | POA: Diagnosis not present

## 2021-01-04 DIAGNOSIS — M25561 Pain in right knee: Secondary | ICD-10-CM | POA: Diagnosis not present

## 2021-01-06 DIAGNOSIS — M25561 Pain in right knee: Secondary | ICD-10-CM | POA: Diagnosis not present

## 2021-01-08 DIAGNOSIS — M25661 Stiffness of right knee, not elsewhere classified: Secondary | ICD-10-CM | POA: Diagnosis not present

## 2021-01-08 DIAGNOSIS — M25561 Pain in right knee: Secondary | ICD-10-CM | POA: Diagnosis not present

## 2021-01-10 DIAGNOSIS — S83231A Complex tear of medial meniscus, current injury, right knee, initial encounter: Secondary | ICD-10-CM | POA: Diagnosis not present

## 2021-02-07 DIAGNOSIS — S83231A Complex tear of medial meniscus, current injury, right knee, initial encounter: Secondary | ICD-10-CM | POA: Diagnosis not present

## 2021-02-07 DIAGNOSIS — Z01818 Encounter for other preprocedural examination: Secondary | ICD-10-CM | POA: Diagnosis not present

## 2021-02-07 DIAGNOSIS — Z1159 Encounter for screening for other viral diseases: Secondary | ICD-10-CM | POA: Diagnosis not present

## 2021-02-15 DIAGNOSIS — L309 Dermatitis, unspecified: Secondary | ICD-10-CM | POA: Diagnosis not present

## 2021-03-07 DIAGNOSIS — M25561 Pain in right knee: Secondary | ICD-10-CM | POA: Diagnosis not present

## 2021-03-14 DIAGNOSIS — L738 Other specified follicular disorders: Secondary | ICD-10-CM | POA: Diagnosis not present

## 2021-03-14 DIAGNOSIS — I8392 Asymptomatic varicose veins of left lower extremity: Secondary | ICD-10-CM | POA: Diagnosis not present

## 2021-03-14 DIAGNOSIS — L814 Other melanin hyperpigmentation: Secondary | ICD-10-CM | POA: Diagnosis not present

## 2021-03-14 DIAGNOSIS — L821 Other seborrheic keratosis: Secondary | ICD-10-CM | POA: Diagnosis not present

## 2021-03-14 DIAGNOSIS — B351 Tinea unguium: Secondary | ICD-10-CM | POA: Diagnosis not present

## 2021-03-14 DIAGNOSIS — L02426 Furuncle of left lower limb: Secondary | ICD-10-CM | POA: Diagnosis not present

## 2021-03-14 DIAGNOSIS — L02425 Furuncle of right lower limb: Secondary | ICD-10-CM | POA: Diagnosis not present

## 2021-03-14 DIAGNOSIS — Z7189 Other specified counseling: Secondary | ICD-10-CM | POA: Diagnosis not present

## 2021-03-14 DIAGNOSIS — L218 Other seborrheic dermatitis: Secondary | ICD-10-CM | POA: Diagnosis not present

## 2021-03-14 DIAGNOSIS — L84 Corns and callosities: Secondary | ICD-10-CM | POA: Diagnosis not present

## 2021-03-18 DIAGNOSIS — N952 Postmenopausal atrophic vaginitis: Secondary | ICD-10-CM | POA: Diagnosis not present

## 2021-03-18 DIAGNOSIS — N3943 Post-void dribbling: Secondary | ICD-10-CM | POA: Diagnosis not present

## 2021-03-18 DIAGNOSIS — Z6823 Body mass index (BMI) 23.0-23.9, adult: Secondary | ICD-10-CM | POA: Diagnosis not present

## 2021-03-18 DIAGNOSIS — Z01419 Encounter for gynecological examination (general) (routine) without abnormal findings: Secondary | ICD-10-CM | POA: Diagnosis not present

## 2021-03-23 ENCOUNTER — Other Ambulatory Visit: Payer: Self-pay

## 2021-03-23 DIAGNOSIS — I83893 Varicose veins of bilateral lower extremities with other complications: Secondary | ICD-10-CM

## 2021-04-03 DIAGNOSIS — M25561 Pain in right knee: Secondary | ICD-10-CM | POA: Diagnosis not present

## 2021-04-03 DIAGNOSIS — M25661 Stiffness of right knee, not elsewhere classified: Secondary | ICD-10-CM | POA: Diagnosis not present

## 2021-04-10 DIAGNOSIS — M25661 Stiffness of right knee, not elsewhere classified: Secondary | ICD-10-CM | POA: Diagnosis not present

## 2021-04-10 DIAGNOSIS — M25561 Pain in right knee: Secondary | ICD-10-CM | POA: Diagnosis not present

## 2021-04-15 DIAGNOSIS — M25561 Pain in right knee: Secondary | ICD-10-CM | POA: Diagnosis not present

## 2021-04-15 DIAGNOSIS — M25661 Stiffness of right knee, not elsewhere classified: Secondary | ICD-10-CM | POA: Diagnosis not present

## 2021-04-17 DIAGNOSIS — M25561 Pain in right knee: Secondary | ICD-10-CM | POA: Diagnosis not present

## 2021-04-17 DIAGNOSIS — M25661 Stiffness of right knee, not elsewhere classified: Secondary | ICD-10-CM | POA: Diagnosis not present

## 2021-04-19 ENCOUNTER — Encounter (HOSPITAL_COMMUNITY): Payer: Medicare HMO

## 2021-04-23 DIAGNOSIS — M25661 Stiffness of right knee, not elsewhere classified: Secondary | ICD-10-CM | POA: Diagnosis not present

## 2021-04-23 DIAGNOSIS — M25561 Pain in right knee: Secondary | ICD-10-CM | POA: Diagnosis not present

## 2021-04-26 DIAGNOSIS — M25661 Stiffness of right knee, not elsewhere classified: Secondary | ICD-10-CM | POA: Diagnosis not present

## 2021-04-26 DIAGNOSIS — M25561 Pain in right knee: Secondary | ICD-10-CM | POA: Diagnosis not present

## 2021-05-01 DIAGNOSIS — M25661 Stiffness of right knee, not elsewhere classified: Secondary | ICD-10-CM | POA: Diagnosis not present

## 2021-05-01 DIAGNOSIS — M25561 Pain in right knee: Secondary | ICD-10-CM | POA: Diagnosis not present

## 2021-05-02 DIAGNOSIS — M25561 Pain in right knee: Secondary | ICD-10-CM | POA: Diagnosis not present

## 2021-05-03 DIAGNOSIS — M25561 Pain in right knee: Secondary | ICD-10-CM | POA: Diagnosis not present

## 2021-05-03 DIAGNOSIS — M25661 Stiffness of right knee, not elsewhere classified: Secondary | ICD-10-CM | POA: Diagnosis not present

## 2021-05-07 ENCOUNTER — Encounter (HOSPITAL_COMMUNITY): Payer: Medicare HMO

## 2021-05-07 DIAGNOSIS — M25661 Stiffness of right knee, not elsewhere classified: Secondary | ICD-10-CM | POA: Diagnosis not present

## 2021-05-07 DIAGNOSIS — M25561 Pain in right knee: Secondary | ICD-10-CM | POA: Diagnosis not present

## 2021-05-22 ENCOUNTER — Ambulatory Visit (HOSPITAL_COMMUNITY)
Admission: RE | Admit: 2021-05-22 | Discharge: 2021-05-22 | Disposition: A | Discharge status: Home / Self Care | Payer: Medicare HMO | Source: Ambulatory Visit | Attending: Vascular Surgery | Admitting: Vascular Surgery

## 2021-05-22 ENCOUNTER — Ambulatory Visit: Payer: Medicare HMO | Admitting: Physician Assistant

## 2021-05-22 ENCOUNTER — Encounter: Payer: Self-pay | Admitting: Gastroenterology

## 2021-05-22 ENCOUNTER — Ambulatory Visit: Payer: Medicare HMO | Admitting: Gastroenterology

## 2021-05-22 ENCOUNTER — Other Ambulatory Visit: Payer: Self-pay

## 2021-05-22 VITALS — BP 128/80 | HR 56 | Temp 97.7°F | Resp 20 | Ht 64.0 in | Wt 133.0 lb

## 2021-05-22 VITALS — BP 128/72 | HR 76 | Ht 63.25 in | Wt 133.0 lb

## 2021-05-22 DIAGNOSIS — K219 Gastro-esophageal reflux disease without esophagitis: Secondary | ICD-10-CM | POA: Diagnosis not present

## 2021-05-22 DIAGNOSIS — I872 Venous insufficiency (chronic) (peripheral): Secondary | ICD-10-CM | POA: Diagnosis not present

## 2021-05-22 DIAGNOSIS — R143 Flatulence: Secondary | ICD-10-CM

## 2021-05-22 DIAGNOSIS — Z86010 Personal history of colonic polyps: Secondary | ICD-10-CM | POA: Diagnosis not present

## 2021-05-22 DIAGNOSIS — I83893 Varicose veins of bilateral lower extremities with other complications: Secondary | ICD-10-CM | POA: Diagnosis not present

## 2021-05-22 DIAGNOSIS — I8393 Asymptomatic varicose veins of bilateral lower extremities: Secondary | ICD-10-CM

## 2021-05-22 MED ORDER — SUTAB 1479-225-188 MG PO TABS: 1.0000 | ORAL_TABLET | Freq: Once | ORAL | 0 refills | Status: AC

## 2021-05-22 MED ORDER — FAMOTIDINE 20 MG PO TABS: 20.0000 mg | ORAL_TABLET | ORAL | 4 refills | Status: DC | PRN

## 2021-05-22 NOTE — Addendum Note (Signed)
Addended byDoylene Bode on: 05/22/2021 01:19 PM   Modules accepted: Orders

## 2021-05-22 NOTE — Progress Notes (Signed)
Requested by:  Glenis Smoker, MD [place],  [place] [address]  Reason for consultation: Asymptomatic BLE varicose veins   History of Present Illness   Alexandria Hudson is a 75 y.o. (02-28-1946) female who presents for evaluation of bilateral lower extremity varicose veins, left greater than right. She says she first started noticing varicose veins 40 years ago when she was pregnant with her children. More recently however they have become more prominent and bothersome especially in the left leg. She says if she is up on her legs for a while her veins really bulge out. She has itching and burning pains in them. She does have some aching and throbbing as well as fatigue but she explains that that is overall tolerable. She does notice some swelling in lower aspect of left leg more recently if she is up on her feet for most of the day. She elevates in a recliner which helps some. She says she wore knee high compression stockings in the past but has been several years ago. She does have family history of venous disease but no DVT.   Venous symptoms include: aching, heavy, tired, throbbing, burning, itching, swelling Onset/duration:  >40 years, more recently increased symptoms  Occupation:  retired Aggravating factors: standing, prolonged ambulation Alleviating factors: elevation Compression:  knee high OTC Helps:  minimally Pain medications:  none Previous vein procedures:  none History of DVT:  no  Past Medical History:  Diagnosis Date   Cancer ([place])    skin cancer   Carpal tunnel syndrome    BOTH WRISTS    Cataracts, both eyes    GERD (gastroesophageal reflux disease)    Headache    MIGRAINES   Osteoporosis    PONV (postoperative nausea and vomiting)    NO NAUSEA SINCE TUBES TIED    Past Surgical History:  Procedure Laterality Date   ABDOMINAL HYSTERECTOMY  38 YRS AGO   PARTIAL   ANTERIOR AND POSTERIOR REPAIR WITH SACROSPINOUS FIXATION N/A  01/03/2019   Procedure: ANTERIOR AND POSTERIOR REPAIR WITH SACROSPINOUS FIXATION;  Surgeon: Linda Hedges, DO;  Location: [facility];  Service: Gynecology;  Laterality: N/A;   CATARACT EXTRACTION, BILATERAL     CHOLECYSTECTOMY  1986   COLONOSCOPY     HERNIA REPAIR  1993   INGUINAL   SKIN CANCER AREAS REMOVED     TONSILLECTOMY     TUBAL LIGATION      Social History   Socioeconomic History   Marital status: Divorced    Spouse name: Not on file   Number of children: Not on file   Years of education: Not on file   Highest education level: Not on file  Occupational History   Not on file  Tobacco Use   Smoking status: Never   Smokeless tobacco: Never  Vaping Use   Vaping Use: Never used  Substance and Sexual Activity   Alcohol use: Yes    Comment: OCC   Drug use: Never   Sexual activity: Not on file  Other Topics Concern   Not on file  Social History Narrative   Not on file   Social Determinants of Health   Financial Resource Strain: Not on file  Food Insecurity: Not on file  Transportation Needs: Not on file  Physical Activity: Not on file  Stress: Not on file  Social Connections: Not on file  Intimate Partner Violence: Not on file    Family History  Problem Relation Age of  Onset   Colon cancer Neg Hx    Esophageal cancer Neg Hx    Rectal cancer Neg Hx    Stomach cancer Neg Hx     Current Outpatient Medications  Medication Sig Dispense Refill   ASPIRIN 81 PO Take by mouth as needed.     B Complex Vitamins (VITAMIN-B COMPLEX PO) Take by mouth. Takes 2 chewables daily- takes 5-7- days a week     Coenzyme Q10 (COQ10) 100 MG CAPS Take by mouth daily.      ibuprofen (ADVIL,MOTRIN) 600 MG tablet Take 1 tablet (600 mg total) by mouth every 6 (six) hours as needed. 30 tablet 1   lidocaine (XYLOCAINE) 2 % jelly Apply 1 application topically as needed. 30 mL 0   Magnesium 100 MG TABS Take by mouth as needed.     milk thistle 175 MG tablet Take 175 mg by  mouth as needed.     NON FORMULARY OSTEO/DENX 1 TAB DAY HAS GLUCOSAMIN AND CHONDRITIN     omeprazole (PRILOSEC) 40 MG capsule Take 40 mg by mouth daily. Taking 20 mg at this time     OVER THE COUNTER MEDICATION CALCIUM 300 MG DAILY     OVER THE COUNTER MEDICATION ASEA LIQUID 2 OUNCES PER DAY     oxyCODONE (OXY IR/ROXICODONE) 5 MG immediate release tablet Take 1-2 tablets (5-10 mg total) by mouth every 4 (four) hours as needed for moderate pain. (Patient not taking: Reported on 01/18/2020) 20 tablet 0   UNABLE TO FIND VITAMIN D 3 2000 UNITS TAKES 2 CAPS 5 X WEEK     UNABLE TO FIND CORTISONE CREAM 2.5% PRN     vitamin k 100 MCG tablet Take 100 mcg by mouth daily. 2 TABS 5 DAYS WEEK     No current facility-administered medications for this visit.    Allergies  Allergen Reactions   Povidone-Iodine     REACTION: skin irritation   Tylenol [Acetaminophen] Palpitations    REVIEW OF SYSTEMS (negative unless checked):   Cardiac:  []  Chest pain or chest pressure? []  Shortness of breath upon activity? []  Shortness of breath when lying flat? []  Irregular heart rhythm?  Vascular:  []  Pain in calf, thigh, or hip brought on by walking? []  Pain in feet at night that wakes you up from your sleep? []  Blood clot in your veins? [x]  Leg swelling?  Pulmonary:  []  Oxygen at home? []  Productive cough? []  Wheezing?  Neurologic:  []  Sudden weakness in arms or legs? []  Sudden numbness in arms or legs? []  Sudden onset of difficult speaking or slurred speech? []  Temporary loss of vision in one eye? []  Problems with dizziness?  Gastrointestinal:  []  Blood in stool? []  Vomited blood?  Genitourinary:  []  Burning when urinating? []  Blood in urine?  Psychiatric:  []  Major depression  Hematologic:  []  Bleeding problems? []  Problems with blood clotting?  Dermatologic:  []  Rashes or ulcers?  Constitutional:  []  Fever or chills?  Ear/Nose/Throat:  []  Change in hearing? []  Nose  bleeds? []  Sore throat?  Musculoskeletal:  []  Back pain? []  Joint pain? []  Muscle pain?   Physical Examination     Vitals:   05/22/21 1109  BP: 128/80  Pulse: (!) 56  Resp: 20  Temp: 97.7 F (36.5 C)  TempSrc: Temporal  SpO2: 98%  Weight: 133 lb (60.3 kg)  Height: 5\' 4"  (1.626 m)   Body mass index is 22.83 kg/m.  General:  WDWN in NAD; vital signs documented  above Gait: Normal HENT: WNL, normocephalic Pulmonary: normal non-labored breathing , without  wheezing Cardiac: regular HR, without  Murmurs without carotid bruit Abdomen: soft, NT, no masses Vascular Exam/Pulses:2+ radial pulses, 2+ femoral, popliteal, DP and PT pulses bilaterally Extremities: with varicose veins left greater than right leg, with reticular veins, without edema, without stasis pigmentation, without lipodermatosclerosis, without ulcers  Left medial leg varicose vein  Right medial leg varicose vein  Musculoskeletal: no muscle wasting or atrophy  Neurologic: A&O X 3;  No focal weakness or paresthesias are detected Psychiatric:  The pt has Normal affect.  Non-invasive Vascular Imaging   BLE Venous Insufficiency Duplex (05/22/21):  RLE:  No DVT and SVT GSV reflux SFJ to proximal calf GSV diameter 0.56-0.67 No SSV reflux  CFV deep venous reflux AASV without reflux Popliteal fluid collection noted 3.69 x 0.68 cm  LLE: No DVT and SVT GSV reflux SFJ to knee GSV diameter 0.57 -0.74 SSV reflux mid calf No deep venous reflux   Medical Decision Making   MYLEIGH AMARA is a 75 y.o. female who presents with: BLE chronic venous insufficiency, with varicose veins with pain. Her duplex today shows RLE deep reflux, and bilateral superficial venous reflux. Her GSV of adequate size to be considered for lazer ablation. I think based on her symptoms she would be a good candidate for this especially in her LLE. I discussed the lazer ablation with her as well as possible stab phlebectomies.  I have  recommended thigh high compression stockings, daily elevation, exercise, and refraining from prolonged sitting or standing I discussed with the patient the use of her 20-30 mm thigh high compression stockings and need for 3 month trial of such. The patient will follow up in 3 months with Dr. Edwena Bunde, PA-C [facility] Office: [phone]  05/22/2021, 11:18 AM  Clinic MD: Oneida Alar

## 2021-05-22 NOTE — Progress Notes (Signed)
HPI :  75 year old female here for a follow-up visit to discuss surveillance colonoscopy and other questions she has about her GI tract and bowels.  She had a screening colonoscopy with me in March 2021.  At that point in time she had 14 polyps removed, majority were sessile serrated.  She states she had not had polyps on her previous exam 10 years before that.  She had several questions about why she has polyps, and why she has so many of them.  We discussed her history.  She does not smoke cigarettes, no family history of colon cancer.  Her sister did have bile duct cancer in 2019 and she had several questions about that.  She does have a lot of intestinal gas that bothers her.  Has usually a bowel movement per day but can depend on her diet.  Occasional loose stool, usually not much of any constipation.  She eats a lot of salads, can have coffee and sodas as well.  She denies any blood in her stools.  She has had what she thinks is GERD symptoms for the past several years.  She has been using omeprazole 20 mg as needed over this time, frequency can vary based on how she feels.  Her main symptoms are occasional pyrosis/regurgitation and hoarseness in her throat.  Omeprazole seems to work well if she takes it.  She denies any dysphagia.  She does have osteoporosis and inquired about side effects of PPIs and other options.  She has not tried Pepcid or Zantac in the past.  She inquires about a supplement called "asea" which she has used since 2013 and its relation to potential development of polyps.  She has been seen in the past for calcium levels that of an elevated, seeing endocrinology and has questions about that.  She inquires about why her hair is brittle.  States she has had normal thyroid testing.  She wonders if this is related to her GI tract.  No unexpected weight loss.     Colonoscopy 01/18/20 - The perianal and digital rectal examinations were normal. - Two flat polyps were found in  the cecum. The polyps were 3 to 7 mm in size. These polyps were removed with a cold snare. Resection and retrieval were complete. - Five flat and sessile polyps were found in the ascending colon. The polyps were 3 to 8 mm in size. These polyps were removed with a cold snare, one of which could only be grasped and removed in the retroflexed position. Resection and retrieval were complete. - Four flat polyps were found in the transverse colon. The polyps were 3 to 12 mm in size. These polyps were removed with a cold snare. Resection and retrieval were complete. - Two flat polyps were found in the splenic flexure. The polyps were 4 to 6 mm in size. These polyps were removed with a cold snare. Resection and retrieval were complete. - A 4 mm polyp was found in the descending colon. The polyp was flat. The polyp was removed with a cold snare. Resection and retrieval were complete. - Many medium-mouthed diverticula were found in the transverse colon and left colon. - Internal hemorrhoids were found during retroflexion. The hemorrhoids were moderate. - The exam was otherwise without abnormality.  Diagnosis Surgical [P], colon, transverse, cecum and ascending, splenic flexure, descending, polyp (14) - SESSILE SERRATED POLYP (MULTIPLE FRAGMENTS). - HYPERPLASTIC POLYP. - NO DYSPLASIA OR MALIGNANCY.    Past Medical History:  Diagnosis Date  Cancer ([place])    skin cancer   Carpal tunnel syndrome    BOTH WRISTS    Cataracts, both eyes    GERD (gastroesophageal reflux disease)    Headache    MIGRAINES   Osteoporosis    PONV (postoperative nausea and vomiting)    NO NAUSEA SINCE TUBES TIED     Past Surgical History:  Procedure Laterality Date   ABDOMINAL HYSTERECTOMY  75 YRS AGO   PARTIAL   ANTERIOR AND POSTERIOR REPAIR WITH SACROSPINOUS FIXATION N/A 01/03/2019   Procedure: ANTERIOR AND POSTERIOR REPAIR WITH SACROSPINOUS FIXATION;  Surgeon: Linda Hedges, DO;  Location: [place];  Service: Gynecology;  Laterality: N/A;   CATARACT EXTRACTION, BILATERAL     CHOLECYSTECTOMY  1986   COLONOSCOPY     HERNIA REPAIR  1993   INGUINAL   SKIN CANCER AREAS REMOVED     TONSILLECTOMY     TUBAL LIGATION     Family History  Problem Relation Age of Onset   Colon cancer Neg Hx    Esophageal cancer Neg Hx    Rectal cancer Neg Hx    Stomach cancer Neg Hx    Social History   Tobacco Use   Smoking status: Never   Smokeless tobacco: Never  Vaping Use   Vaping Use: Never used  Substance Use Topics   Alcohol use: Yes    Comment: OCC   Drug use: Never   Current Outpatient Medications  Medication Sig Dispense Refill   AMBULATORY NON FORMULARY MEDICATION ASEA Take 6 oz my mouth daily     ASPIRIN 81 PO Take by mouth as needed.     Cholecalciferol (VITAMIN D3) 50 MCG (2000 UT) capsule Take 5,000 Units by mouth daily.     Coenzyme Q10 (COQ10) 100 MG CAPS Take 1 tablet by mouth. Four times weekly     famotidine (PEPCID) 20 MG tablet Take 1 tablet (20 mg total) by mouth as needed for heartburn or indigestion. 30 tablet 4   hydrocortisone 2.5 % cream Apply 1 application topically as needed.     ibuprofen (ADVIL) 200 MG tablet Take 200 mg by mouth as needed.     Magnesium 100 MG TABS Take by mouth as needed (four times weekly).     milk thistle 175 MG tablet Take 175 mg by mouth as needed.     omeprazole (PRILOSEC OTC) 20 MG tablet Take 20-40 mg by mouth as needed.     QUERCETIN PO Take 500 mg by mouth daily.     Sodium Sulfate-Mag Sulfate-KCl (SUTAB) [phone] MG TABS Take 1 kit by mouth once for 1 dose. 24 tablet 0   vitamin k 100 MCG tablet Take 100 mcg by mouth daily. 2 TABS 5 DAYS WEEK     NON FORMULARY OSTEO/DENX 1 TAB DAY HAS GLUCOSAMIN AND CHONDRITIN (Patient not taking: Reported on 05/22/2021)     No current facility-administered medications for this visit.   Allergies  Allergen Reactions   Povidone-Iodine     REACTION: skin irritation   Tylenol  [Acetaminophen] Palpitations     Review of Systems: All systems reviewed and negative except where noted in HPI.    Lab Results  Component Value Date   WBC 13.9 (H) 01/07/2019   HGB 12.2 01/07/2019   HCT 38.3 01/07/2019   MCV 91.4 01/07/2019   PLT 279 01/07/2019    Lab Results  Component Value Date   CREATININE 0.61 01/07/2019   BUN 10 01/07/2019  NA 135 01/07/2019   K 4.1 01/07/2019   CL 103 01/07/2019   CO2 24 01/07/2019    Lab Results  Component Value Date   ALT 17 01/04/2019   AST 19 01/04/2019   ALKPHOS 67 01/04/2019   BILITOT 0.8 01/04/2019       Physical Exam: BP 128/72 (BP Location: Left Arm, Patient Position: Sitting, Cuff Size: Normal)   Pulse 76   Ht 5' 3.25" (1.607 m) Comment: height measured without shoes  Wt 133 lb (60.3 kg)   BMI 23.37 kg/m  Constitutional: Pleasant,well-developed, female in no acute distress. Neurological: Alert and oriented to person place and time. Psychiatric: Normal mood and affect. Behavior is normal.   ASSESSMENT AND PLAN: 75 year old female here for reassessment of variety of issues today.:  Excessive gas History of colon polyps GERD  We spent majority of our time today discussing the issues as outlined above although she also inquires about several other issues as outlined in history.  She does have baseline significant gas production we discussed differential diagnosis for that.  Could be related to some of the food she is eating, counseled her on the low FODMAP diet and gave her handouts about that to see if that will help.  She can consider adding a probiotic as well pending her course.  She inquires about her long-term reflux regimen, we did discuss long-term use of chronic PPIs and potential associated risks.  She does have osteoporosis and for mild/as needed therapy would recommend switching omeprazole to Pepcid 20 mg as needed.  She was in agreement with this.  We then discussed her history of polyps and  answered several questions about that.  She does have a significant polyp burden and surveillance colonoscopy is recommended at this time.  Discussed risk benefits of colonoscopy and anesthesia and she wants to proceed, further recommendations pending results.  Plan: - low FODMAP diet for excessive gas - can consider probiotic pending her course - stop omeprazole, switch to Pepcid 53m PRN for GERD - [facility] I spent 45 minutes of time, including in depth chart review, face-to-face time with the patient, and documentating this encounter  SCarolina Cellar MD [facility]

## 2021-05-22 NOTE — Patient Instructions (Addendum)
If you are age 74 or older, your body mass index should be between 23-30. Your Body mass index is 23.37 kg/m. If this is out of the aforementioned range listed, please consider follow up with your Primary Care Provider.  If you are age 66 or younger, your body mass index should be between 19-25. Your Body mass index is 23.37 kg/m. If this is out of the aformentioned range listed, please consider follow up with your Primary Care Provider.   __________________________________________________________  The [place] [facility] providers would like to encourage you to use [facility] to communicate with providers for non-urgent requests or questions.  Due to long hold times on the telephone, sending your provider a message by [facility] may be a faster and more efficient way to get a response.  Please allow 48 business hours for a response.  Please remember that this is for non-urgent requests.    You have been scheduled for a colonoscopy. Please follow written instructions given to you at your visit today.  Please pick up your prep supplies at the pharmacy within the next 1-3 days. If you use inhalers (even only as needed), please bring them with you on the day of your procedure.  We are giving you a Low-FODMAP diet handout today. FODMAPs are short-chain carbohydrates (sugars) that are highly fermentable, which means that they go through chemical changes in the GI system, and are poorly absorbed during digestion. When FODMAPs reach the colon (large intestine), bacteria ferment these sugars, turning them into gas and chemicals. This stretches the walls of the colon, causing abdominal bloating, distension, cramping, pain, and/or changes in bowel habits in many patients with IBS. FODMAPs are not unhealthy or harmful, but may exacerbate GI symptoms in those with sensitive GI tracts.  We have sent the following medications to your pharmacy for you to pick up at your convenience: Pepcid 20 mg: Take as needed    Thank you  for entrusting me with your care and for choosing [facility], Dr. [place] Cellar

## 2021-05-27 ENCOUNTER — Encounter (HOSPITAL_COMMUNITY): Payer: Medicare HMO

## 2021-06-30 ENCOUNTER — Encounter: Payer: Self-pay | Admitting: Gastroenterology

## 2021-07-26 ENCOUNTER — Other Ambulatory Visit: Payer: Self-pay

## 2021-07-26 ENCOUNTER — Ambulatory Visit (AMBULATORY_SURGERY_CENTER): Payer: Medicare HMO | Admitting: Gastroenterology

## 2021-07-26 ENCOUNTER — Encounter: Payer: Self-pay | Admitting: Gastroenterology

## 2021-07-26 VITALS — BP 130/80 | HR 75 | Temp 97.5°F | Resp 16 | Ht 63.25 in | Wt 133.0 lb

## 2021-07-26 DIAGNOSIS — K635 Polyp of colon: Secondary | ICD-10-CM | POA: Diagnosis not present

## 2021-07-26 DIAGNOSIS — Z86010 Personal history of colonic polyps: Secondary | ICD-10-CM | POA: Diagnosis not present

## 2021-07-26 DIAGNOSIS — D123 Benign neoplasm of transverse colon: Secondary | ICD-10-CM

## 2021-07-26 DIAGNOSIS — K388 Other specified diseases of appendix: Secondary | ICD-10-CM | POA: Diagnosis not present

## 2021-07-26 DIAGNOSIS — K219 Gastro-esophageal reflux disease without esophagitis: Secondary | ICD-10-CM | POA: Diagnosis not present

## 2021-07-26 MED ORDER — SODIUM CHLORIDE 0.9 % IV SOLN: 500.0000 mL | Freq: Once | INTRAVENOUS | Status: DC

## 2021-07-26 NOTE — Progress Notes (Signed)
[facility] History and Physical   Primary Care Physician:  Glenis Smoker, MD   Reason for Procedure:   History of colon polyps  Plan:    colonoscopy     HPI: Alexandria Hudson is a 75 y.o. female  here for colonoscopy surveillance - 14 SSPs removed 01/2020. Patient denies any bowel symptoms at this time. Has had some ongoing issues with gas. Otherwise feels well without any cardiopulmonary symptoms.    Past Medical History:  Diagnosis Date   Cancer ([place])    skin cancer   Carpal tunnel syndrome    BOTH WRISTS    Cataracts, both eyes    GERD (gastroesophageal reflux disease)    Headache    MIGRAINES   Osteoporosis    PONV (postoperative nausea and vomiting)    NO NAUSEA SINCE TUBES TIED    Past Surgical History:  Procedure Laterality Date   ABDOMINAL HYSTERECTOMY  39 YRS AGO   PARTIAL   ANTERIOR AND POSTERIOR REPAIR WITH SACROSPINOUS FIXATION N/A 01/03/2019   Procedure: ANTERIOR AND POSTERIOR REPAIR WITH SACROSPINOUS FIXATION;  Surgeon: Linda Hedges, DO;  Location: [place];  Service: Gynecology;  Laterality: N/A;   CATARACT EXTRACTION, BILATERAL     CHOLECYSTECTOMY  1986   COLONOSCOPY     HERNIA REPAIR  1993   INGUINAL   SKIN CANCER AREAS REMOVED     TONSILLECTOMY     TUBAL LIGATION      Prior to Admission medications   Medication Sig Start Date End Date Taking? Authorizing Provider  AMBULATORY NON FORMULARY MEDICATION ASEA Take 6 oz my mouth daily   Yes [provider]  Ascorbic Acid (VITAMIN C) 100 MG CHEW    Yes [provider]  Cholecalciferol (VITAMIN D3) 50 MCG (2000 UT) capsule Take 5,000 Units by mouth daily.   Yes [provider]  Coenzyme Q10 (COQ10) 100 MG CAPS Take 1 tablet by mouth. Four times weekly   Yes [provider]  hydrocortisone 2.5 % cream Apply 1 application topically as needed.   Yes [provider]  ibuprofen (ADVIL) 200 MG tablet Take 200 mg by mouth as  needed.   Yes [provider]  Magnesium 100 MG TABS Take by mouth as needed (four times weekly).   Yes [provider]  Multiple Vitamin (MULTIVITAMIN ADULT) TABS    Yes [provider]  QUERCETIN PO Take 500 mg by mouth daily.   Yes [provider]  Specialty Vitamins Products (BIOTIN PLUS KERATIN) 10000-100 MCG-MG TABS    Yes [provider]  triamcinolone cream (KENALOG) 0.1 % 1 application [id number]  Yes [provider]  vitamin k 100 MCG tablet Take 100 mcg by mouth daily. 2 TABS 5 DAYS WEEK   Yes [provider]  ASPIRIN 81 PO Take by mouth as needed. Patient not taking: Reported on 07/26/2021    [provider]  [employer] SUNSCREEN SPF 30 EX apply Patient not taking: Reported on 07/26/2021 06/04/20   [provider]  famotidine (PEPCID) 20 MG tablet Take 1 tablet (20 mg total) by mouth as needed for heartburn or indigestion. Patient not taking: Reported on 07/26/2021 05/22/21   Yetta Flock, MD  milk thistle 175 MG tablet Take 175 mg by mouth as needed. Patient not taking: Reported on 07/26/2021    [provider]  NON FORMULARY OSTEO/DENX 1 TAB DAY HAS [place] Patient not taking: Reported on 05/22/2021    [provider]  omeprazole (PRILOSEC OTC) 20 MG tablet Take 20-40 mg by mouth as needed.    [provider]    Current Outpatient Medications  Medication Sig Dispense Refill   AMBULATORY NON FORMULARY MEDICATION ASEA Take 6 oz my mouth daily     Ascorbic Acid (VITAMIN C) 100 MG CHEW      Cholecalciferol (VITAMIN D3) 50 MCG (2000 UT) capsule Take 5,000 Units by mouth daily.     Coenzyme Q10 (COQ10) 100 MG CAPS Take 1 tablet by mouth. Four times weekly     hydrocortisone 2.5 % cream Apply 1 application topically as needed.     ibuprofen (ADVIL) 200 MG tablet Take 200 mg by mouth as needed.     Magnesium 100 MG TABS Take by mouth as needed (four times weekly).      Multiple Vitamin (MULTIVITAMIN ADULT) TABS      QUERCETIN PO Take 500 mg by mouth daily.     Specialty Vitamins Products (BIOTIN PLUS KERATIN) 10000-100 MCG-MG TABS      triamcinolone cream (KENALOG) 0.1 % 1 application     vitamin k 100 MCG tablet Take 100 mcg by mouth daily. 2 TABS 5 DAYS WEEK     ASPIRIN 81 PO Take by mouth as needed. (Patient not taking: Reported on 07/26/2021)     [employer] SUNSCREEN SPF 30 EX apply (Patient not taking: Reported on 07/26/2021)     famotidine (PEPCID) 20 MG tablet Take 1 tablet (20 mg total) by mouth as needed for heartburn or indigestion. (Patient not taking: Reported on 07/26/2021) 30 tablet 4   milk thistle 175 MG tablet Take 175 mg by mouth as needed. (Patient not taking: Reported on 07/26/2021)     NON FORMULARY OSTEO/DENX 1 TAB DAY HAS GLUCOSAMIN AND CHONDRITIN (Patient not taking: Reported on 05/22/2021)     omeprazole (PRILOSEC OTC) 20 MG tablet Take 20-40 mg by mouth as needed.     Current Facility-Administered Medications  Medication Dose Route Frequency Provider Last Rate Last Admin   0.9 %  sodium chloride infusion  500 mL Intravenous Once Ruther Ephraim, Carlota Raspberry, MD        Allergies as of 07/26/2021 - Review Complete 07/26/2021  Allergen Reaction Noted   Povidone-iodine     Tylenol [acetaminophen] Palpitations 01/03/2019    Family History  Problem Relation Age of Onset   Crohn's disease Father    Colon cancer Neg Hx    Esophageal cancer Neg Hx    Rectal cancer Neg Hx    Stomach cancer Neg Hx     Social History   Socioeconomic History   Marital status: Divorced    Spouse name: Not on file   Number of children: Not on file   Years of education: Not on file   Highest education level: Not on file  Occupational History   Not on file  Tobacco Use   Smoking status: Never   Smokeless tobacco: Never  Vaping Use   Vaping Use: Never used  Substance and Sexual Activity   Alcohol use: Yes    Comment: OCC   Drug use: Never   Sexual  activity: Not on file  Other Topics Concern   Not on file  Social History Narrative   Not on file   Social Determinants of Health   Financial Resource Strain: Not on file  Food Insecurity: Not on file  Transportation Needs: Not on file  Physical Activity: Not on file  Stress: Not on file  Social  Connections: Not on file  Intimate Partner Violence: Not on file    Review of Systems: All other review of systems negative except as mentioned in the HPI.  Physical Exam: Vital signs BP (!) 169/96   Pulse 71   Temp (!) 97.5 F (36.4 C)   Resp 18   Ht 5' 3.25" (1.607 m)   Wt 133 lb (60.3 kg)   SpO2 100%   BMI 23.37 kg/m   General:   Alert,  Well-developed, well-nourished, pleasant and cooperative in NAD Lungs:  Clear throughout to auscultation.   Heart:  Regular rate and rhythm Abdomen:  Soft, nontender and nondistended.   Neuro/Psych:  Alert and cooperative. Normal mood and affect. A and O x 3  Jolly Mango, MD [facility]

## 2021-07-26 NOTE — Patient Instructions (Signed)
Resume previous diet and continue present medications. Awaiting pathology results. If appendiceal orifice pathology shows pre-cancerous polyp, patient will need referral to general surgery for appendectomy.   YOU HAD AN ENDOSCOPIC PROCEDURE TODAY AT [facility]:   Refer to the procedure report that was given to you for any specific questions about what was found during the examination.  If the procedure report does not answer your questions, please call your gastroenterologist to clarify.  If you requested that your care partner not be given the details of your procedure findings, then the procedure report has been included in a sealed envelope for you to review at your convenience later.  YOU SHOULD EXPECT: Some feelings of bloating in the abdomen. Passage of more gas than usual.  Walking can help get rid of the air that was put into your GI tract during the procedure and reduce the bloating. If you had a lower endoscopy (such as a colonoscopy or flexible sigmoidoscopy) you may notice spotting of blood in your stool or on the toilet paper. If you underwent a bowel prep for your procedure, you may not have a normal bowel movement for a few days.  Please Note:  You might notice some irritation and congestion in your nose or some drainage.  This is from the oxygen used during your procedure.  There is no need for concern and it should clear up in a day or so.  SYMPTOMS TO REPORT IMMEDIATELY:  Following lower endoscopy (colonoscopy or flexible sigmoidoscopy):  Excessive amounts of blood in the stool  Significant tenderness or worsening of abdominal pains  Swelling of the abdomen that is new, acute  Fever of 100F or higher  For urgent or emergent issues, a gastroenterologist can be reached at any hour by calling [phone]. Do not use MyChart messaging for urgent concerns.    DIET:  We do recommend a small meal at first, but then you may proceed to your regular diet.  Drink  plenty of fluids but you should avoid alcoholic beverages for 24 hours.  ACTIVITY:  You should plan to take it easy for the rest of today and you should NOT DRIVE or use heavy machinery until tomorrow (because of the sedation medicines used during the test).    FOLLOW UP: Our staff will call the number listed on your records 48-72 hours following your procedure to check on you and address any questions or concerns that you may have regarding the information given to you following your procedure. If we do not reach you, we will leave a message.  We will attempt to reach you two times.  During this call, we will ask if you have developed any symptoms of COVID 19. If you develop any symptoms (ie: fever, flu-like symptoms, shortness of breath, cough etc.) before then, please call [phone].  If you test positive for Covid 19 in the 2 weeks post procedure, please call and report this information to [place].    If any biopsies were taken you will be contacted by phone or by letter within the next 1-3 weeks.  Please call us at [phone] if you have not heard about the biopsies in 3 weeks.    SIGNATURES/CONFIDENTIALITY: You and/or your care partner have signed paperwork which will be entered into your electronic medical record.  These signatures attest to the fact that that the information above on your After Visit Summary has been reviewed and is understood.  Full responsibility of the confidentiality of this  discharge information lies with you and/or your care-partner.  

## 2021-07-26 NOTE — Progress Notes (Signed)
Data will not transfer from monitor, vs being posted manually.   

## 2021-07-26 NOTE — Progress Notes (Signed)
1513 Ephedrine 10 mg given IV due to low BP, MD updated.

## 2021-07-26 NOTE — Op Note (Signed)
[facility] Patient Name: Alexandria Hudson Procedure Date: 07/26/2021 2:41 PM MRN: [id number] Endoscopist: Remo Lipps P. Havery Moros , MD Age: 75 Referring MD:  Date of Birth: 06-13-1946 Gender: Female Account #: [id number] Procedure:                Colonoscopy Indications:              High risk colon cancer surveillance: Personal                            history of colonic polyps (history of 14 sessile                            serrated polyps removed 01/2020) Medicines:                Monitored Anesthesia Care Procedure:                Pre-Anesthesia Assessment:                           - Prior to the procedure, a History and Physical                            was performed, and patient medications and                            allergies were reviewed. The patient's tolerance of                            previous anesthesia was also reviewed. The risks                            and benefits of the procedure and the sedation                            options and risks were discussed with the patient.                            All questions were answered, and informed consent                            was obtained. Prior Anticoagulants: The patient has                            taken no previous anticoagulant or antiplatelet                            agents. ASA Grade Assessment: II - A patient with                            mild systemic disease. After reviewing the risks                            and benefits, the patient was deemed in  satisfactory condition to undergo the procedure.                           After obtaining informed consent, the colonoscope                            was passed under direct vision. Throughout the                            procedure, the patient's blood pressure, pulse, and                            oxygen saturations were monitored continuously. The                            Olympus PCF-H190DL  [id number]) Colonoscope was                            introduced through the anus and advanced to the the                            terminal ileum, with identification of the                            appendiceal orifice and IC valve. The colonoscopy                            was performed without difficulty. The patient                            tolerated the procedure well. The quality of the                            bowel preparation was good. The terminal ileum,                            ileocecal valve, appendiceal orifice, and rectum                            were photographed. Scope In: 2:59:50 PM Scope Out: 3:22:11 PM Scope Withdrawal Time: 0 hours 18 minutes 45 seconds  Total Procedure Duration: 0 hours 22 minutes 21 seconds  Findings:                 The perianal and digital rectal examinations were                            normal.                           The terminal ileum appeared normal.                           A polypoid lesion was found intermittently  prolapsing out of the appendiceal orifice.                            Initially the AO appeared normal but then a                            polypoid flat lesion prolapsed out of it, could not                            see clear margins, and appeared to invade the                            appendix, suspect sessile serrated lesion. Biopsies                            were taken with a cold forceps for histology.                           A 3 to 4 mm polyp was found in the transverse                            colon. The polyp was flat. The polyp was removed                            with a cold snare. Resection and retrieval were                            complete.                           Multiple medium-mouthed diverticula were found in                            the entire colon.                           Internal hemorrhoids were found during retroflexion.                            The exam was otherwise without abnormality. Complications:            No immediate complications. Estimated blood loss:                            Minimal. Estimated Blood Loss:     Estimated blood loss was minimal. Impression:               - The examined portion of the ileum was normal.                           - Polypoid lesion intermittently prolapsing out of                            the appendiceal orifice. Biopsied.                           -  One 3 to 4 mm polyp in the transverse colon,                            removed with a cold snare. Resected and retrieved.                           - Diverticulosis in the entire examined colon.                           - Internal hemorrhoids.                           - The examination was otherwise normal.                           Suspect patient may likely need appendectomy given                            findings today, will await pathology results. Recommendation:           - Patient has a contact number available for                            emergencies. The signs and symptoms of potential                            delayed complications were discussed with the                            patient. Return to normal activities tomorrow.                            Written discharge instructions were provided to the                            patient.                           - Resume previous diet.                           - Continue present medications.                           - Await pathology results with further                            recommendations. If appendiceal orifice pathology                            shows pre-cancerous polyp, patient will need                            referral to general surgery for appendectomy Crestina Strike P. Jacqulyn Barresi, MD 07/26/2021 3:30:07 PM This report has been signed electronically.

## 2021-07-26 NOTE — Progress Notes (Signed)
Report given to PACU, vss 

## 2021-07-30 ENCOUNTER — Telehealth: Payer: Self-pay | Admitting: *Deleted

## 2021-07-30 NOTE — Telephone Encounter (Signed)
  Follow up Call-  Call back number 07/26/2021 01/18/2020  Post procedure Call Back phone  # [phone]  Permission to leave phone message Yes Yes  Some recent data might be hidden     Patient questions:  Do you have a fever, pain , or abdominal swelling? No. Pain Score  0 *  Have you tolerated food without any problems? Yes.    Have you been able to return to your normal activities? Yes.    Do you have any questions about your discharge instructions: Diet   No. Medications  No. Follow up visit  No.  Do you have questions or concerns about your Care? No.  Actions: * If pain score is 4 or above: No action needed, pain <4.  Have you developed a fever since your procedure? no  2.   Have you had an respiratory symptoms (SOB or cough) since your procedure? no  3.   Have you tested positive for COVID 19 since your procedure no  4.   Have you had any family members/close contacts diagnosed with the COVID 19 since your procedure?  no   If yes to any of these questions please route to Joylene John, RN and Joella Prince, RN

## 2021-08-15 ENCOUNTER — Other Ambulatory Visit: Payer: Self-pay | Admitting: Family Medicine

## 2021-08-15 DIAGNOSIS — Z1231 Encounter for screening mammogram for malignant neoplasm of breast: Secondary | ICD-10-CM

## 2021-08-22 DIAGNOSIS — K388 Other specified diseases of appendix: Secondary | ICD-10-CM | POA: Diagnosis not present

## 2021-08-23 ENCOUNTER — Other Ambulatory Visit: Payer: Self-pay

## 2021-08-23 ENCOUNTER — Ambulatory Visit
Admission: RE | Admit: 2021-08-23 | Discharge: 2021-08-23 | Disposition: A | Discharge status: Home / Self Care | Payer: Medicare HMO | Source: Ambulatory Visit | Attending: Family Medicine | Admitting: Family Medicine

## 2021-08-23 DIAGNOSIS — Z1231 Encounter for screening mammogram for malignant neoplasm of breast: Secondary | ICD-10-CM | POA: Diagnosis not present

## 2021-08-28 ENCOUNTER — Telehealth: Payer: Self-pay | Admitting: Gastroenterology

## 2021-08-28 ENCOUNTER — Ambulatory Visit: Payer: Medicare HMO | Admitting: Vascular Surgery

## 2021-08-28 NOTE — Telephone Encounter (Signed)
Inbound call from patient stating she was scheduled for 09/20/2021 to have procedure with [facility] which is a date she cannot do and is wanting to know if ok to wait until January to have procedure done.  Please advise.

## 2021-08-29 NOTE — Telephone Encounter (Signed)
Ultimately up to her to discuss this with Dr. Hassell Done, her surgeon. I think probably okay to wait until January if she can't do it sooner. Thanks

## 2021-08-29 NOTE — Telephone Encounter (Signed)
Pt returned call, we have discussed recommendation. Pt verbalized understanding and had no concerns at the end of the call.

## 2021-08-29 NOTE — Telephone Encounter (Signed)
Lm on vm for patient to return call 

## 2021-09-13 DIAGNOSIS — H04123 Dry eye syndrome of bilateral lacrimal glands: Secondary | ICD-10-CM | POA: Diagnosis not present

## 2021-09-18 DIAGNOSIS — I8392 Asymptomatic varicose veins of left lower extremity: Secondary | ICD-10-CM | POA: Diagnosis not present

## 2021-09-18 DIAGNOSIS — L570 Actinic keratosis: Secondary | ICD-10-CM | POA: Diagnosis not present

## 2021-09-18 DIAGNOSIS — X32XXXA Exposure to sunlight, initial encounter: Secondary | ICD-10-CM | POA: Diagnosis not present

## 2021-09-18 DIAGNOSIS — L820 Inflamed seborrheic keratosis: Secondary | ICD-10-CM | POA: Diagnosis not present

## 2021-09-18 DIAGNOSIS — L821 Other seborrheic keratosis: Secondary | ICD-10-CM | POA: Diagnosis not present

## 2021-09-18 DIAGNOSIS — D2261 Melanocytic nevi of right upper limb, including shoulder: Secondary | ICD-10-CM | POA: Diagnosis not present

## 2021-09-18 DIAGNOSIS — L708 Other acne: Secondary | ICD-10-CM | POA: Diagnosis not present

## 2021-09-18 DIAGNOSIS — D2271 Melanocytic nevi of right lower limb, including hip: Secondary | ICD-10-CM | POA: Diagnosis not present

## 2021-09-18 DIAGNOSIS — L814 Other melanin hyperpigmentation: Secondary | ICD-10-CM | POA: Diagnosis not present

## 2021-09-18 DIAGNOSIS — D485 Neoplasm of uncertain behavior of skin: Secondary | ICD-10-CM | POA: Diagnosis not present

## 2021-11-15 NOTE — Progress Notes (Signed)
Sent message, via epic in basket, requesting orders in epic from surgeon.  

## 2021-11-17 ENCOUNTER — Ambulatory Visit: Payer: Self-pay | Admitting: Surgery

## 2021-11-18 ENCOUNTER — Other Ambulatory Visit: Payer: Self-pay

## 2021-11-18 ENCOUNTER — Encounter (HOSPITAL_COMMUNITY)
Admission: RE | Admit: 2021-11-18 | Discharge: 2021-11-18 | Disposition: A | Discharge status: Home / Self Care | Payer: Medicare HMO | Source: Ambulatory Visit | Attending: Surgery | Admitting: Surgery

## 2021-11-18 ENCOUNTER — Encounter (HOSPITAL_COMMUNITY): Payer: Self-pay

## 2021-11-18 VITALS — BP 138/77 | HR 66 | Temp 98.0°F | Resp 18 | Ht 63.0 in | Wt 129.5 lb

## 2021-11-18 DIAGNOSIS — Z01812 Encounter for preprocedural laboratory examination: Secondary | ICD-10-CM | POA: Insufficient documentation

## 2021-11-18 DIAGNOSIS — Z01818 Encounter for other preprocedural examination: Secondary | ICD-10-CM

## 2021-11-18 HISTORY — DX: Pneumonia, unspecified organism: J18.9

## 2021-11-18 HISTORY — DX: Family history of other specified conditions: Z84.89

## 2021-11-18 LAB — CBC
HCT: 42.3 % (ref 36.0–46.0)
Hemoglobin: 13.4 g/dL (ref 12.0–15.0)
MCH: 29.8 pg (ref 26.0–34.0)
MCHC: 31.7 g/dL (ref 30.0–36.0)
MCV: 94.2 fL (ref 80.0–100.0)
Platelets: 272 10*3/uL (ref 150–400)
RBC: 4.49 MIL/uL (ref 3.87–5.11)
RDW: 13.8 % (ref 11.5–15.5)
WBC: 6.7 10*3/uL (ref 4.0–10.5)
nRBC: 0 % (ref 0.0–0.2)

## 2021-11-18 NOTE — Progress Notes (Addendum)
Anesthesia Review:  PCP: DR Lindell Noe- has physical scheduled fro 11/26/2021.   Cardiologist : Chest x-ray : EKG : Echo : Stress test: Cardiac Cath :  Activity level: can do a flight of stairs without difficulty  Sleep Study/ CPAP : none  Fasting Blood Sugar :      / Checks Blood Sugar -- times a day:   Blood Thinner/ Instructions /Last Dose: ASA / Instructions/ Last Dose :   No covid test- ambulatory surgery

## 2021-11-18 NOTE — Progress Notes (Signed)
Your procedure is scheduled on:  12/06/2021.   Report to [facility] Main  Entrance   Report to admitting at   [phone]     Call this number if you have problems the morning of surgery [phone]    REMEMBER: NO  SOLID FOOD CANDY OR GUM AFTER MIDNIGHT. CLEAR LIQUIDS UNTIL   0430am       . NOTHING BY MOUTH EXCEPT CLEAR LIQUIDS UNTIL    0430am   . PLEASE FINISH ENSURE DRINK PER SURGEON ORDER  WHICH NEEDS TO BE COMPLETED AT     0430am  .      CLEAR LIQUID DIET   Foods Allowed                                                                    Coffee and tea, regular and decaf                            Fruit ices (not with fruit pulp)                                      Iced Popsicles                                    Carbonated beverages, regular and diet                                    Cranberry, grape and apple juices Sports drinks like Gatorade Lightly seasoned clear broth or consume(fat free) Sugar, honey syrup ___________________________________________________________________      BRUSH YOUR TEETH MORNING OF SURGERY AND RINSE YOUR MOUTH OUT, NO CHEWING GUM CANDY OR MINTS.     Take these medicines the morning of surgery with A SIP OF WATER:  none   DO NOT TAKE ANY DIABETIC MEDICATIONS DAY OF YOUR SURGERY                               You may not have any metal on your body including hair pins and              piercings  Do not wear jewelry, make-up, lotions, powders or perfumes, deodorant             Do not wear nail polish on your fingernails.  Do not shave  48 hours prior to surgery.              Men may shave face and neck.   Do not bring valuables to the hospital. [place].  Contacts, dentures or bridgework may not be worn into surgery.  Leave suitcase in the car. After surgery it may be brought to your room.     Patients discharged the day of surgery will not be allowed to drive  home. IF YOU ARE HAVING SURGERY AND GOING HOME THE SAME DAY, YOU MUST HAVE AN ADULT TO DRIVE YOU HOME AND BE WITH YOU FOR 24 HOURS. YOU MAY GO HOME BY TAXI OR UBER OR ORTHERWISE, BUT AN ADULT MUST ACCOMPANY YOU HOME AND STAY WITH YOU FOR 24 HOURS.  Name and phone number of your driver:  Special Instructions: N/A              Please read over the following fact sheets you were given: _____________________________________________________________________  [facility] - Preparing for Surgery Before surgery, you can play an important role.  Because skin is not sterile, your skin needs to be as free of germs as possible.  You can reduce the number of germs on your skin by washing with CHG (chlorahexidine gluconate) soap before surgery.  CHG is an antiseptic cleaner which kills germs and bonds with the skin to continue killing germs even after washing. Please DO NOT use if you have an allergy to CHG or antibacterial soaps.  If your skin becomes reddened/irritated stop using the CHG and inform your nurse when you arrive at Short Stay. Do not shave (including legs and underarms) for at least 48 hours prior to the first CHG shower.  You may shave your face/neck. Please follow these instructions carefully:  1.  Shower with CHG Soap the night before surgery and the  morning of Surgery.  2.  If you choose to wash your hair, wash your hair first as usual with your  normal  shampoo.  3.  After you shampoo, rinse your hair and body thoroughly to remove the  shampoo.                           4.  Use CHG as you would any other liquid soap.  You can apply chg directly  to the skin and wash                       Gently with a scrungie or clean washcloth.  5.  Apply the CHG Soap to your body ONLY FROM THE NECK DOWN.   Do not use on face/ open                           Wound or open sores. Avoid contact with eyes, ears mouth and genitals (private parts).                       Wash face,  Genitals (private parts) with  your normal soap.             6.  Wash thoroughly, paying special attention to the area where your surgery  will be performed.  7.  Thoroughly rinse your body with warm water from the neck down.  8.  DO NOT shower/wash with your normal soap after using and rinsing off  the CHG Soap.                9.  Pat yourself dry with a clean towel.            10.  Wear clean pajamas.            11.  Place clean sheets on your bed the night of your first shower and do not  sleep with pets. Day of Surgery : Do not apply any lotions/deodorants the morning of  surgery.  Please wear clean clothes to the hospital/surgery center.  FAILURE TO FOLLOW THESE INSTRUCTIONS MAY RESULT IN THE CANCELLATION OF YOUR SURGERY PATIENT SIGNATURE_________________________________  NURSE SIGNATURE__________________________________  ________________________________________________________________________

## 2021-11-26 DIAGNOSIS — R7301 Impaired fasting glucose: Secondary | ICD-10-CM | POA: Diagnosis not present

## 2021-11-26 DIAGNOSIS — E559 Vitamin D deficiency, unspecified: Secondary | ICD-10-CM | POA: Diagnosis not present

## 2021-11-26 DIAGNOSIS — K219 Gastro-esophageal reflux disease without esophagitis: Secondary | ICD-10-CM | POA: Diagnosis not present

## 2021-11-26 DIAGNOSIS — E7800 Pure hypercholesterolemia, unspecified: Secondary | ICD-10-CM | POA: Diagnosis not present

## 2021-11-26 DIAGNOSIS — Z79899 Other long term (current) drug therapy: Secondary | ICD-10-CM | POA: Diagnosis not present

## 2021-11-26 DIAGNOSIS — M810 Age-related osteoporosis without current pathological fracture: Secondary | ICD-10-CM | POA: Diagnosis not present

## 2021-11-26 DIAGNOSIS — Z0000 Encounter for general adult medical examination without abnormal findings: Secondary | ICD-10-CM | POA: Diagnosis not present

## 2021-12-04 NOTE — H&P (Signed)
REFERRING PHYSICIAN: Manus Gunning*  PROVIDER: Joya San, MD  MRN: [id number] DOB: [birth year] DATE OF ENCOUNTER: 08/22/2021  Subjective   Chief Complaint: New Consultation (Appendiceal polyp)   History of Present Illness: Alexandria Hudson is a 76 y.o. female who is seen today as an office consultation at the request of Dr. Havery Moros for evaluation of New Consultation (Appendiceal polyp) .   In September she had a colonoscopic follow-up by Dr. Havery Moros and was found to have a sessile serrated polyp at the mouth of her appendix. He has a very nice picture of that in her colonoscopy report. As result we talked about appendectomy to get this polyp and get a margin. I think we would try to get a margin of cecum we stapled across this. I made her aware that if this were more invasive that it might require more of an ileocecectomy but were not prepared to do that at the initial setting.  She has had a previous open cholecystectomy and a right inguinal herniorrhaphy. Both of these were done open. She has had borderline hypercalcemia. She had a long history of no polyps and recently has had multiple sessile polyps and she is followed by Dr. Havery Moros. We will plan to do outpatient laparoscopic appendectomy at [facility].  We have discussed this plan again in the preop holding area.    Review of Systems: See HPI as well for other ROS.  ROS   Medical History: Past Medical History:  Diagnosis Date   GERD (gastroesophageal reflux disease)   History of cancer   Hypertension   Patient Active Problem List  Diagnosis   Gastroesophageal reflux disease   Pelvic organ prolapse quantification stage 3 rectocele   Xerophthalmia   Xerostomia   Past Surgical History:  Procedure Laterality Date   CHOLECYSTECTOMY   HERNIA REPAIR   HYSTERECTOMY   TONSILLECTOMY    Allergies  Allergen Reactions   Acetaminophen Palpitations  Other reaction(s): palpitations    Povidone-Iodine Rash  Other reaction(s): redness, irritation REACTION: skin irritation REACTION: skin irritation   Current Outpatient Medications on File Prior to Visit  Medication Sig Dispense Refill   BIOTIN ORAL Take by mouth   famotidine (PEPCID) 20 MG tablet Take by mouth   phytonadione, vit K1, (VITAMIN K) 100 mcg tablet Take by mouth   quercetin 500 mg Cap Take by mouth   VITAMIN B COMPLEX ORAL Take 1 tablet by mouth once daily   No current facility-administered medications on file prior to visit.   Family History  Problem Relation Age of Onset   Skin cancer Mother   Diabetes Mother   Skin cancer Father   Colon cancer Father    Social History   Tobacco Use  Smoking Status Never Smoker  Smokeless Tobacco Never Used    Social History   Socioeconomic History   Marital status: Divorced  Tobacco Use   Smoking status: Never Smoker   Smokeless tobacco: Never Used  Scientific laboratory technician Use: Never used  Substance and Sexual Activity   Alcohol use: Never   Drug use: Never   Objective:   Vitals:  08/22/21 1005  BP: 130/80  Pulse: 63  SpO2: (!) 90%  Weight: 60.1 kg (132 lb 6.4 oz)  Height: 160 cm (5\' 3" )   Body mass index is 23.45 kg/m.  Physical Exam General: Well maintained younger than stated age appearing white female no acute distress. HEENT : Unremarkable Chest: Clear Heart: Sinus rhythm with no bruits or murmurs  Breast: Not examined Abdomen: Nontender. Scars from prior open right and upper and lower quadrant incisions GU not examined Rectal not performed Extremities full range of motion Neuro alert and oriented x3. Motor and sensory function grossly intact.  Labs, Imaging and Diagnostic Testing: Colonoscopy images reviewed along with the pathology report  Assessment and Plan:  Diagnoses and all orders for this visit:  Hyperplastic polyp of appendix    Serrated sessile polyp the base of the appendix. Plan laparoscopic appendectomy at  [facility].  Await permanent path to see if more invasive surgery is indicated.      Teddi Badalamenti Donia Pounds, MD

## 2021-12-06 ENCOUNTER — Ambulatory Visit (HOSPITAL_COMMUNITY)
Admission: RE | Admit: 2021-12-06 | Discharge: 2021-12-06 | Disposition: A | Discharge status: Home / Self Care | Payer: Medicare HMO | Source: Home / Self Care | Attending: Surgery | Admitting: Surgery

## 2021-12-06 ENCOUNTER — Encounter (HOSPITAL_COMMUNITY)
Admission: RE | Disposition: A | Discharge status: Home / Self Care | Payer: Self-pay | Source: Home / Self Care | Attending: Surgery

## 2021-12-06 ENCOUNTER — Ambulatory Visit (HOSPITAL_COMMUNITY): Payer: Medicare HMO | Admitting: Physician Assistant

## 2021-12-06 ENCOUNTER — Other Ambulatory Visit: Payer: Self-pay

## 2021-12-06 ENCOUNTER — Ambulatory Visit (HOSPITAL_COMMUNITY): Payer: Medicare HMO | Admitting: Registered Nurse

## 2021-12-06 ENCOUNTER — Ambulatory Visit (HOSPITAL_COMMUNITY)
Admission: RE | Admit: 2021-12-06 | Discharge: 2021-12-06 | Disposition: A | Discharge status: Home / Self Care | Payer: Medicare HMO | Attending: Surgery | Admitting: Surgery

## 2021-12-06 ENCOUNTER — Encounter (HOSPITAL_COMMUNITY): Payer: Self-pay | Admitting: Surgery

## 2021-12-06 DIAGNOSIS — K219 Gastro-esophageal reflux disease without esophagitis: Secondary | ICD-10-CM | POA: Insufficient documentation

## 2021-12-06 DIAGNOSIS — M7989 Other specified soft tissue disorders: Secondary | ICD-10-CM | POA: Diagnosis not present

## 2021-12-06 DIAGNOSIS — S93402A Sprain of unspecified ligament of left ankle, initial encounter: Secondary | ICD-10-CM

## 2021-12-06 DIAGNOSIS — K388 Other specified diseases of appendix: Secondary | ICD-10-CM | POA: Diagnosis not present

## 2021-12-06 DIAGNOSIS — M25572 Pain in left ankle and joints of left foot: Secondary | ICD-10-CM | POA: Diagnosis not present

## 2021-12-06 DIAGNOSIS — K3580 Unspecified acute appendicitis: Secondary | ICD-10-CM | POA: Diagnosis not present

## 2021-12-06 DIAGNOSIS — D121 Benign neoplasm of appendix: Secondary | ICD-10-CM | POA: Insufficient documentation

## 2021-12-06 HISTORY — PX: LAPAROSCOPIC APPENDECTOMY: SHX408

## 2021-12-06 SURGERY — APPENDECTOMY, LAPAROSCOPIC
Anesthesia: General | Site: Abdomen

## 2021-12-06 MED ORDER — ROCURONIUM BROMIDE 10 MG/ML (PF) SYRINGE
PREFILLED_SYRINGE | INTRAVENOUS | Status: AC
  Filled 2021-12-06: qty 10

## 2021-12-06 MED ORDER — BUPIVACAINE LIPOSOME 1.3 % IJ SUSP
INTRAMUSCULAR | Status: AC
  Filled 2021-12-06: qty 20

## 2021-12-06 MED ORDER — BUPIVACAINE LIPOSOME 1.3 % IJ SUSP
INTRAMUSCULAR | Status: DC | PRN
  Administered 2021-12-06: 20 mL

## 2021-12-06 MED ORDER — SCOPOLAMINE 1 MG/3DAYS TD PT72
1.0000 | MEDICATED_PATCH | TRANSDERMAL | Status: DC
  Administered 2021-12-06: 1.5 mg via TRANSDERMAL
  Filled 2021-12-06: qty 1

## 2021-12-06 MED ORDER — KETOROLAC TROMETHAMINE 30 MG/ML IJ SOLN
15.0000 mg | Freq: Once | INTRAMUSCULAR | Status: AC | PRN
  Administered 2021-12-06: 15 mg via INTRAVENOUS

## 2021-12-06 MED ORDER — DEXAMETHASONE SODIUM PHOSPHATE 10 MG/ML IJ SOLN
INTRAMUSCULAR | Status: AC
  Filled 2021-12-06: qty 1

## 2021-12-06 MED ORDER — ONDANSETRON HCL 4 MG/2ML IJ SOLN
4.0000 mg | Freq: Once | INTRAMUSCULAR | Status: AC | PRN
  Administered 2021-12-06: 4 mg via INTRAVENOUS

## 2021-12-06 MED ORDER — OXYCODONE HCL 5 MG PO TABS
ORAL_TABLET | ORAL | Status: AC
  Filled 2021-12-06: qty 1

## 2021-12-06 MED ORDER — FENTANYL CITRATE PF 50 MCG/ML IJ SOSY
25.0000 ug | PREFILLED_SYRINGE | INTRAMUSCULAR | Status: DC | PRN
  Administered 2021-12-06: 50 ug via INTRAVENOUS

## 2021-12-06 MED ORDER — EPHEDRINE 5 MG/ML INJ
INTRAVENOUS | Status: AC
  Filled 2021-12-06: qty 5

## 2021-12-06 MED ORDER — LACTATED RINGERS IV SOLN: INTRAVENOUS | Status: DC

## 2021-12-06 MED ORDER — FENTANYL CITRATE (PF) 250 MCG/5ML IJ SOLN
INTRAMUSCULAR | Status: DC | PRN
  Administered 2021-12-06: 50 ug via INTRAVENOUS

## 2021-12-06 MED ORDER — KETOROLAC TROMETHAMINE 30 MG/ML IJ SOLN
INTRAMUSCULAR | Status: AC
  Filled 2021-12-06: qty 1

## 2021-12-06 MED ORDER — DEXAMETHASONE SODIUM PHOSPHATE 10 MG/ML IJ SOLN
INTRAMUSCULAR | Status: DC | PRN
  Administered 2021-12-06: 6 mg via INTRAVENOUS

## 2021-12-06 MED ORDER — HEPARIN SODIUM (PORCINE) 5000 UNIT/ML IJ SOLN
5000.0000 [IU] | Freq: Once | INTRAMUSCULAR | Status: AC
  Administered 2021-12-06: 5000 [IU] via SUBCUTANEOUS
  Filled 2021-12-06: qty 1

## 2021-12-06 MED ORDER — 0.9 % SODIUM CHLORIDE (POUR BTL) OPTIME
TOPICAL | Status: DC | PRN
  Administered 2021-12-06: 1000 mL

## 2021-12-06 MED ORDER — CHLORHEXIDINE GLUCONATE CLOTH 2 % EX PADS: 6.0000 | MEDICATED_PAD | Freq: Once | CUTANEOUS | Status: DC

## 2021-12-06 MED ORDER — ONDANSETRON HCL 4 MG/2ML IJ SOLN
INTRAMUSCULAR | Status: AC
  Filled 2021-12-06: qty 2

## 2021-12-06 MED ORDER — ORAL CARE MOUTH RINSE: 15.0000 mL | Freq: Once | OROMUCOSAL | Status: AC

## 2021-12-06 MED ORDER — PROPOFOL 10 MG/ML IV BOLUS
INTRAVENOUS | Status: AC
  Filled 2021-12-06: qty 20

## 2021-12-06 MED ORDER — FENTANYL CITRATE PF 50 MCG/ML IJ SOSY
PREFILLED_SYRINGE | INTRAMUSCULAR | Status: AC
  Filled 2021-12-06: qty 2

## 2021-12-06 MED ORDER — SODIUM CHLORIDE (PF) 0.9 % IJ SOLN
INTRAMUSCULAR | Status: AC
  Filled 2021-12-06: qty 20

## 2021-12-06 MED ORDER — CHLORHEXIDINE GLUCONATE 0.12 % MT SOLN
15.0000 mL | Freq: Once | OROMUCOSAL | Status: AC
  Administered 2021-12-06: 15 mL via OROMUCOSAL

## 2021-12-06 MED ORDER — LIDOCAINE 2% (20 MG/ML) 5 ML SYRINGE
INTRAMUSCULAR | Status: DC | PRN
Start: 2021-12-06 — End: 2021-12-06
  Administered 2021-12-06: 100 mg via INTRAVENOUS

## 2021-12-06 MED ORDER — SODIUM CHLORIDE 0.9 % IV SOLN
2.0000 g | INTRAVENOUS | Status: AC
  Administered 2021-12-06: 2 g via INTRAVENOUS
  Filled 2021-12-06: qty 2

## 2021-12-06 MED ORDER — ROCURONIUM BROMIDE 10 MG/ML (PF) SYRINGE
PREFILLED_SYRINGE | INTRAVENOUS | Status: DC | PRN
Start: 2021-12-06 — End: 2021-12-06
  Administered 2021-12-06: 50 mg via INTRAVENOUS

## 2021-12-06 MED ORDER — EPHEDRINE SULFATE-NACL 50-0.9 MG/10ML-% IV SOSY
PREFILLED_SYRINGE | INTRAVENOUS | Status: DC | PRN
  Administered 2021-12-06: 10 mg via INTRAVENOUS

## 2021-12-06 MED ORDER — BUPIVACAINE LIPOSOME 1.3 % IJ SUSP: 20.0000 mL | Freq: Once | INTRAMUSCULAR | Status: DC

## 2021-12-06 MED ORDER — ONDANSETRON HCL 4 MG/2ML IJ SOLN
INTRAMUSCULAR | Status: DC | PRN
  Administered 2021-12-06: 4 mg via INTRAVENOUS

## 2021-12-06 MED ORDER — FENTANYL CITRATE (PF) 100 MCG/2ML IJ SOLN
INTRAMUSCULAR | Status: AC
  Filled 2021-12-06: qty 2

## 2021-12-06 MED ORDER — ACETAMINOPHEN 500 MG PO TABS
1000.0000 mg | ORAL_TABLET | ORAL | Status: DC
  Filled 2021-12-06: qty 2

## 2021-12-06 MED ORDER — LACTATED RINGERS IR SOLN
Status: DC | PRN
  Administered 2021-12-06: 1000 mL

## 2021-12-06 MED ORDER — OXYCODONE HCL 5 MG/5ML PO SOLN: 5.0000 mg | Freq: Once | ORAL | Status: DC | PRN

## 2021-12-06 MED ORDER — OXYCODONE HCL 5 MG PO TABS: 5.0000 mg | ORAL_TABLET | Freq: Four times a day (QID) | ORAL | 0 refills | Status: DC | PRN

## 2021-12-06 MED ORDER — OXYCODONE HCL 5 MG PO TABS: 5.0000 mg | ORAL_TABLET | Freq: Once | ORAL | Status: DC | PRN

## 2021-12-06 MED ORDER — PROPOFOL 10 MG/ML IV BOLUS
INTRAVENOUS | Status: DC | PRN
Start: 2021-12-06 — End: 2021-12-06
  Administered 2021-12-06: 100 mg via INTRAVENOUS

## 2021-12-06 MED ORDER — SUGAMMADEX SODIUM 200 MG/2ML IV SOLN
INTRAVENOUS | Status: DC | PRN
  Administered 2021-12-06: 150 mg via INTRAVENOUS

## 2021-12-06 SURGICAL SUPPLY — 48 items
ADH SKN CLS APL DERMABOND .7 (GAUZE/BANDAGES/DRESSINGS) ×1
APL PRP STRL LF DISP 70% ISPRP (MISCELLANEOUS) ×1
APPLIER CLIP ROT 10 11.4 M/L (STAPLE)
APR CLP MED LRG 11.4X10 (STAPLE)
BAG COUNTER SPONGE SURGICOUNT (BAG) IMPLANT
BAG SPEC RTRVL 10 TROC 200 (ENDOMECHANICALS)
BAG SPEC RTRVL LRG 6X4 10 (ENDOMECHANICALS) ×1
BAG SPNG CNTER NS LX DISP (BAG)
CABLE HIGH FREQUENCY MONO STRZ (ELECTRODE) IMPLANT
CHLORAPREP W/TINT 26 (MISCELLANEOUS) ×3 IMPLANT
CLIP APPLIE ROT 10 11.4 M/L (STAPLE) IMPLANT
COVER SURGICAL LIGHT HANDLE (MISCELLANEOUS) ×3 IMPLANT
CUTTER FLEX LINEAR 45M (STAPLE) ×3 IMPLANT
DECANTER SPIKE VIAL GLASS SM (MISCELLANEOUS) IMPLANT
DERMABOND ADVANCED (GAUZE/BANDAGES/DRESSINGS) ×1
DERMABOND ADVANCED .7 DNX12 (GAUZE/BANDAGES/DRESSINGS) ×2 IMPLANT
DRAPE LAPAROSCOPIC ABDOMINAL (DRAPES) IMPLANT
ELECT REM PT RETURN 15FT ADLT (MISCELLANEOUS) ×3 IMPLANT
ENDOLOOP SUT PDS II  0 18 (SUTURE)
ENDOLOOP SUT PDS II 0 18 (SUTURE) IMPLANT
GLOVE SURG ENC TEXT LTX SZ8 (GLOVE) ×3 IMPLANT
GOWN STRL REUS W/TWL XL LVL3 (GOWN DISPOSABLE) ×3 IMPLANT
IRRIG SUCT STRYKERFLOW 2 WTIP (MISCELLANEOUS) ×2
IRRIGATION SUCT STRKRFLW 2 WTP (MISCELLANEOUS) ×2 IMPLANT
KIT BASIN OR (CUSTOM PROCEDURE TRAY) ×3 IMPLANT
KIT TURNOVER KIT A (KITS) IMPLANT
PAD POSITIONING PINK XL (MISCELLANEOUS) ×3 IMPLANT
PENCIL SMOKE EVACUATOR (MISCELLANEOUS) IMPLANT
POUCH RETRIEVAL ECOSAC 10 (ENDOMECHANICALS) IMPLANT
POUCH RETRIEVAL ECOSAC 10MM (ENDOMECHANICALS)
POUCH SPECIMEN RETRIEVAL 10MM (ENDOMECHANICALS) ×3 IMPLANT
RELOAD 45 VASCULAR/THIN (ENDOMECHANICALS) IMPLANT
RELOAD STAPLE 45 2.5 WHT GRN (ENDOMECHANICALS) IMPLANT
RELOAD STAPLE 45 3.5 BLU ETS (ENDOMECHANICALS) IMPLANT
RELOAD STAPLE TA45 3.5 REG BLU (ENDOMECHANICALS) ×4 IMPLANT
SCISSORS LAP 5X45 EPIX DISP (ENDOMECHANICALS) ×3 IMPLANT
SET TUBE SMOKE EVAC HIGH FLOW (TUBING) ×3 IMPLANT
SHEARS HARMONIC ACE PLUS 45CM (MISCELLANEOUS) ×3 IMPLANT
SLEEVE XCEL OPT CAN 5 100 (ENDOMECHANICALS) ×3 IMPLANT
STAPLER VISISTAT 35W (STAPLE) IMPLANT
SUT MNCRL AB 4-0 PS2 18 (SUTURE) ×3 IMPLANT
TOWEL OR 17X26 10 PK STRL BLUE (TOWEL DISPOSABLE) ×3 IMPLANT
TRAY FOLEY MTR SLVR 14FR STAT (SET/KITS/TRAYS/PACK) IMPLANT
TRAY FOLEY MTR SLVR 16FR STAT (SET/KITS/TRAYS/PACK) IMPLANT
TRAY LAPAROSCOPIC (CUSTOM PROCEDURE TRAY) ×3 IMPLANT
TROCAR BLADELESS OPT 5 100 (ENDOMECHANICALS) ×3 IMPLANT
TROCAR XCEL BLUNT TIP 100MML (ENDOMECHANICALS) ×3 IMPLANT
TROCAR XCEL NON-BLD 11X100MML (ENDOMECHANICALS) IMPLANT

## 2021-12-06 NOTE — Anesthesia Procedure Notes (Signed)
Procedure Name: Intubation Date/Time: 12/06/2021 7:38 AM Performed by: Talbot Grumbling, CRNA Pre-anesthesia Checklist: Patient identified, Emergency Drugs available, Suction available and Patient being monitored Patient Re-evaluated:Patient Re-evaluated prior to induction Oxygen Delivery Method: Circle system utilized Preoxygenation: Pre-oxygenation with 100% oxygen Induction Type: IV induction Ventilation: Mask ventilation without difficulty Laryngoscope Size: Mac and 3 Grade View: Grade I Tube type: Oral Tube size: 7.0 mm Number of attempts: 1 Airway Equipment and Method: Stylet Placement Confirmation: ETT inserted through vocal cords under direct vision, positive ETCO2 and breath sounds checked- equal and bilateral Secured at: 20 cm Tube secured with: Tape Dental Injury: Teeth and Oropharynx as per pre-operative assessment

## 2021-12-06 NOTE — Anesthesia Preprocedure Evaluation (Signed)
Anesthesia Evaluation  Patient identified by MRN, date of birth, ID band Patient awake    Reviewed: Allergy & Precautions, NPO status , Patient's Chart, lab work & pertinent test results  History of Anesthesia Complications (+) PONV  Airway Mallampati: II  TM Distance: >3 FB Neck ROM: Full    Dental no notable dental hx.    Pulmonary neg pulmonary ROS,    Pulmonary exam normal breath sounds clear to auscultation       Cardiovascular negative cardio ROS Normal cardiovascular exam Rhythm:Regular Rate:Normal     Neuro/Psych negative neurological ROS  negative psych ROS   GI/Hepatic Neg liver ROS, GERD  ,  Endo/Other  negative endocrine ROS  Renal/GU negative Renal ROS  negative genitourinary   Musculoskeletal negative musculoskeletal ROS (+)   Abdominal   Peds negative pediatric ROS (+)  Hematology negative hematology ROS (+)   Anesthesia Other Findings   Reproductive/Obstetrics negative OB ROS                             Anesthesia Physical Anesthesia Plan  ASA: 2  Anesthesia Plan: General   Post-op Pain Management: Tylenol PO (pre-op)   Induction: Intravenous  PONV Risk Score and Plan: 4 or greater and Ondansetron, Dexamethasone, Droperidol and Treatment may vary due to age or medical condition  Airway Management Planned: Oral ETT  Additional Equipment:   Intra-op Plan:   Post-operative Plan: Extubation in OR  Informed Consent: I have reviewed the patients History and Physical, chart, labs and discussed the procedure including the risks, benefits and alternatives for the proposed anesthesia with the patient or authorized representative who has indicated his/her understanding and acceptance.     Dental advisory given  Plan Discussed with: CRNA and Surgeon  Anesthesia Plan Comments:         Anesthesia Quick Evaluation

## 2021-12-06 NOTE — Transfer of Care (Signed)
Immediate Anesthesia Transfer of Care Note  Patient: Alexandria Hudson  Procedure(s) Performed: APPENDECTOMY LAPAROSCOPIC (Abdomen)  Patient Location: PACU  Anesthesia Type:General  Level of Consciousness: awake, alert  and oriented  Airway & Oxygen Therapy: Patient Spontanous Breathing and Patient connected to face mask oxygen  Post-op Assessment: Report given to RN and Post -op Vital signs reviewed and stable  Post vital signs: Reviewed and stable  Last Vitals:  Vitals Value Taken Time  BP 128/76 12/06/21 0848  Temp    Pulse 79 12/06/21 0848  Resp 18 12/06/21 0848  SpO2 100 % 12/06/21 0848  Vitals shown include unvalidated device data.  Last Pain:  Vitals:   12/06/21 0625  TempSrc:   PainSc: 0-No pain         Complications: No notable events documented.

## 2021-12-06 NOTE — Op Note (Addendum)
Alexandria Hudson  Feb 16, 1946   12/06/2021    PCP:  Glenis Smoker, MD   Surgeon: Kaylyn Lim, MD, FACS  Asst:  Radonna Ricker, MD  Anes:  general  Preop Dx: Polyp at the orifice of the appendix Postop Dx: Same-permanent path and margins pending    Procedure: Laparoscopic appendectomy with two firings across the cecum Location Surgery: WL 4 Complications:  None noted  EBL:   minimal cc  Drains: None  I was personally present during the entire  procedure and immediately available throughout the entire procedure, as documented in my operative note.   Description of Procedure:  The patient was taken to OR 4 .  After anesthesia was administered and the patient was prepped  with chloroprep  and a timeout was performed.  Longitudinal entrance into the umbilicus and Kelly entry into the abdomen with [facility] placement.  Two 5 mm trocars were placed in the LLQ and the RUQ.  The appendix was mobile and with elevation, the mesentery was transected with the Harmonic scalpel.  The base of the appendix was thus isolated.  Two firings of the Ethicon 4.5 cm stapler with blue loads across the cecum delivered the specimen which was placed into a bag and brought out through the Hasson port without difficulty.  The application of the stapler appeared to be well below the orifice.  The specimen was palpated and the fullness of the base appreciated. The port sites were injected with Exparel.  The surgical sites were reinspected and no bleeding was noted.  The umbilical port was closed with a fig of 8 of 0 vicryl under laparoscopic vision.   The skin was closed with 4-0 Monocryl and Dermabond.    The patient tolerated the procedure well and was taken to the PACU in stable condition.     Matt B. Hassell Done, MD, [facility], [place] Lamonte Sakai

## 2021-12-06 NOTE — Interval H&P Note (Signed)
History and Physical Interval Note:  12/06/2021 7:22 AM  Alexandria Hudson  has presented today for surgery, with the diagnosis of appendiceal polyp - WL RM 1.  The various methods of treatment have been discussed with the patient and family. After consideration of risks, benefits and other options for treatment, the patient has consented to  Procedure(s): APPENDECTOMY LAPAROSCOPIC (N/A) as a surgical intervention.  The patient's history has been reviewed, patient examined, no change in status, stable for surgery.  I have reviewed the patient's chart and labs.  Questions were answered to the patient's satisfaction.     Pedro Earls

## 2021-12-06 NOTE — Anesthesia Postprocedure Evaluation (Signed)
Anesthesia Post Note  Patient: Alexandria Hudson  Procedure(s) Performed: APPENDECTOMY LAPAROSCOPIC (Abdomen)     Patient location during evaluation: PACU Anesthesia Type: General Level of consciousness: awake and alert Pain management: pain level controlled Vital Signs Assessment: post-procedure vital signs reviewed and stable Respiratory status: spontaneous breathing, nonlabored ventilation, respiratory function stable and patient connected to nasal cannula oxygen Cardiovascular status: blood pressure returned to baseline and stable Postop Assessment: no apparent nausea or vomiting Anesthetic complications: no   No notable events documented.  Last Vitals:  Vitals:   12/06/21 0927 12/06/21 0930  BP: 128/81 130/73  Pulse: 72 73  Resp: 15   Temp:    SpO2: 100% 97%    Last Pain:  Vitals:   12/06/21 0927  TempSrc:   PainSc: 2                  Whitleigh Garramone S

## 2021-12-07 ENCOUNTER — Encounter (HOSPITAL_COMMUNITY): Payer: Self-pay | Admitting: Surgery

## 2021-12-09 LAB — SURGICAL PATHOLOGY

## 2022-01-31 IMAGING — MG MM DIGITAL SCREENING BILAT W/ TOMO AND CAD
8 series · 8 of 24 positions shown · non-contrast
Comparison: Previous exam(s).

CLINICAL DATA: Screening.

EXAM:
DIGITAL SCREENING BILATERAL MAMMOGRAM WITH TOMOSYNTHESIS AND CAD
TECHNIQUE: Bilateral screening digital craniocaudal and mediolateral oblique
mammograms were obtained. Bilateral screening digital breast
tomosynthesis was performed. The images were evaluated with
computer-aided detection.

[L CC synth-2D]
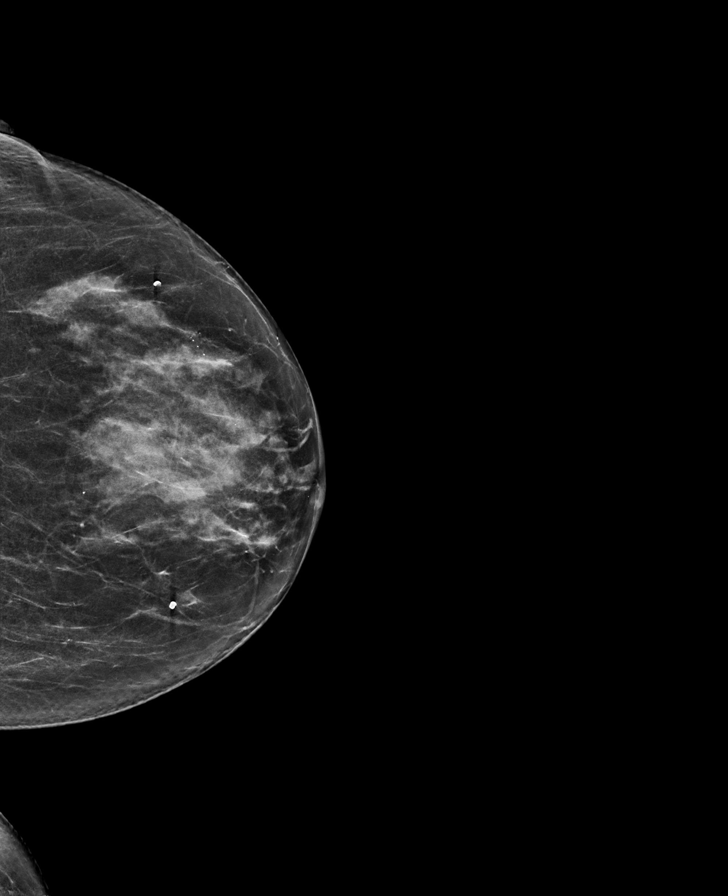

[L MLO synth-2D]
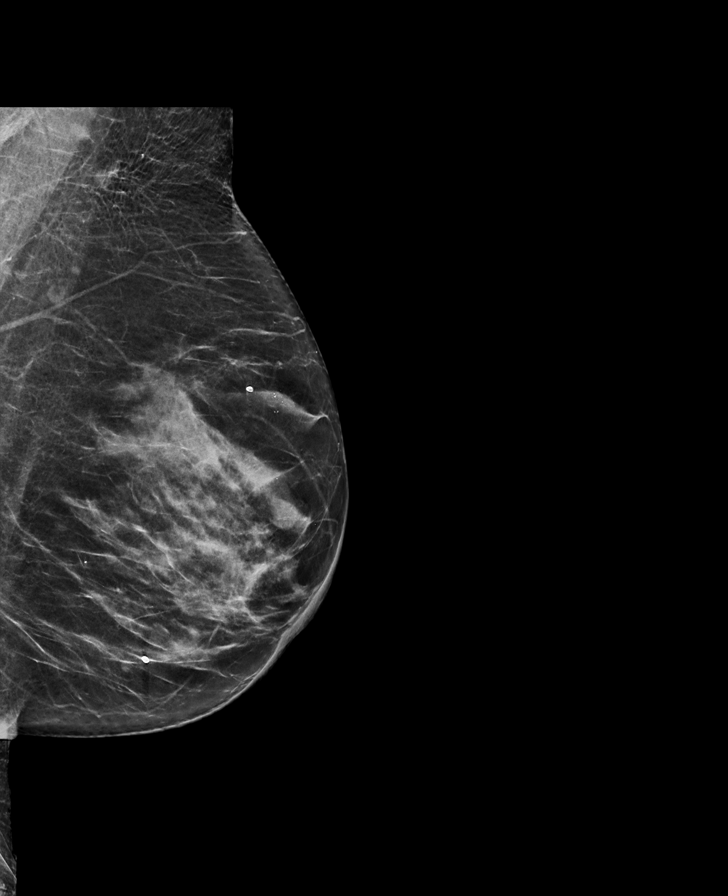

[R CC synth-2D]
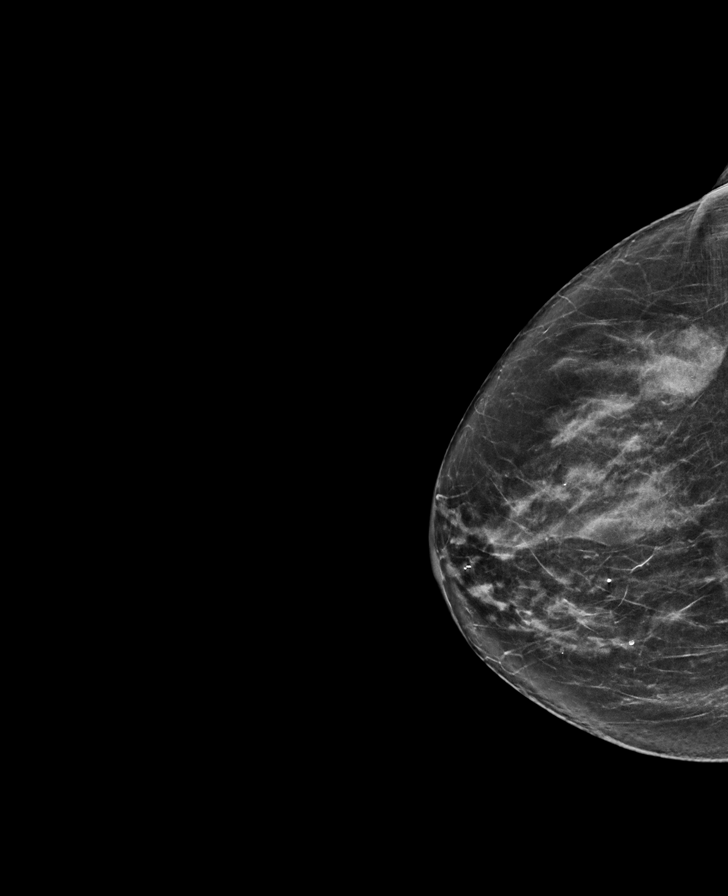

[R MLO synth-2D]
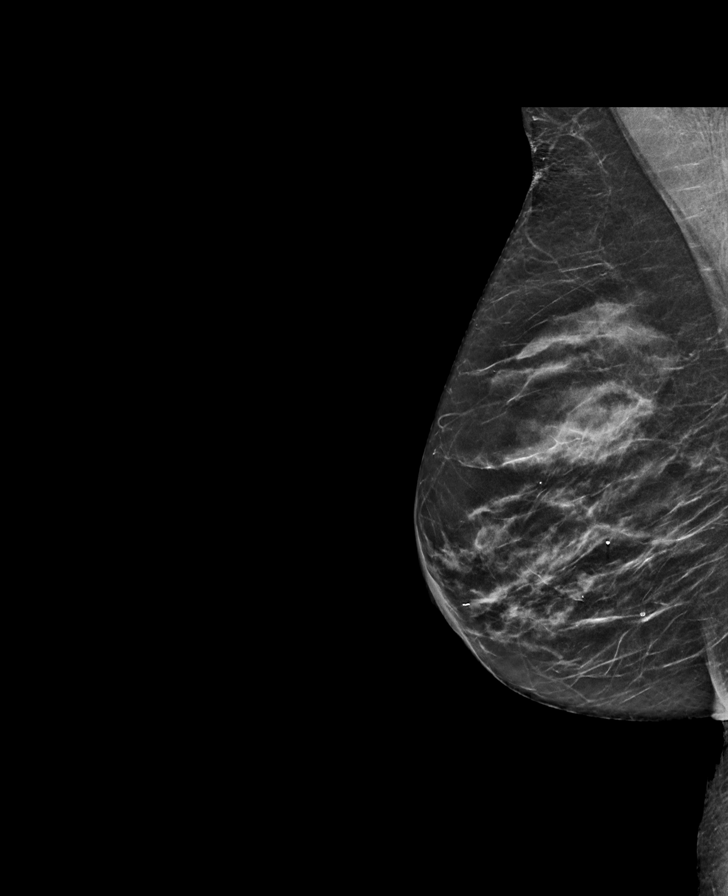

[L MLO tomo · tomo slice 33/65.0]
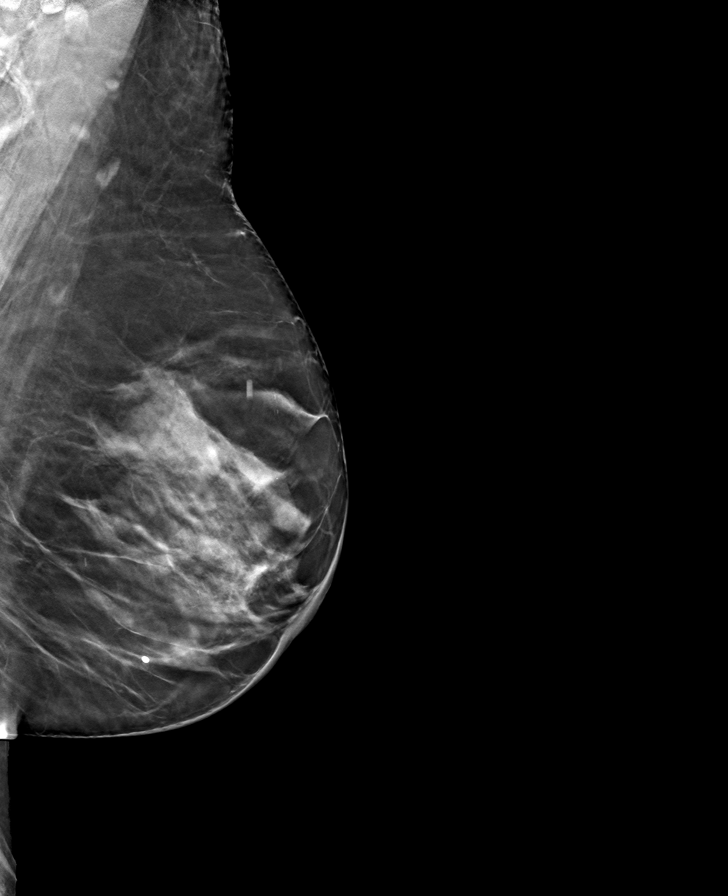

[L CC tomo · tomo slice 33/64.0]
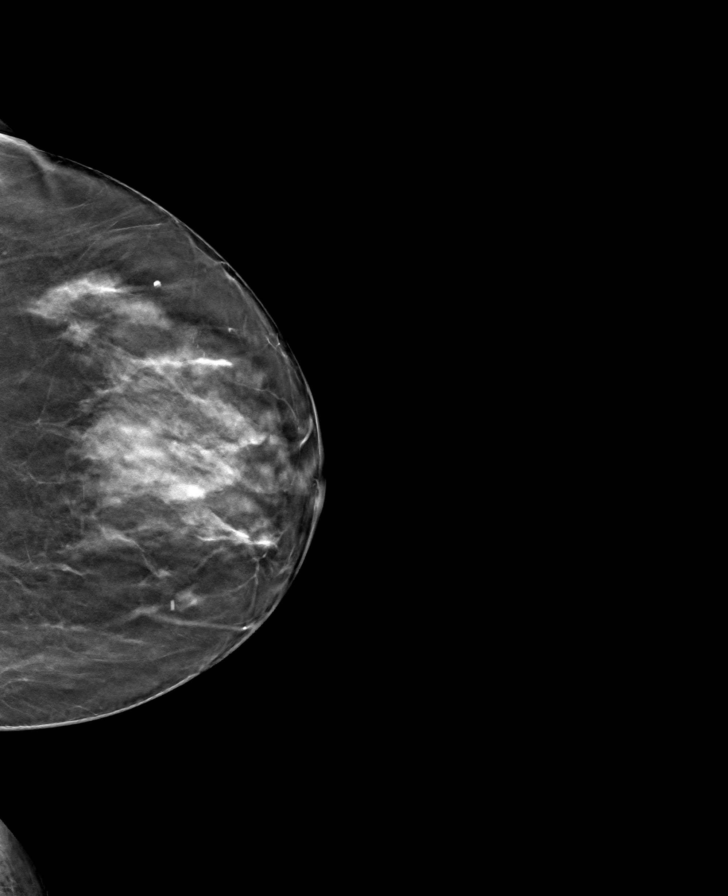

[R CC tomo · tomo slice 33/64.0]
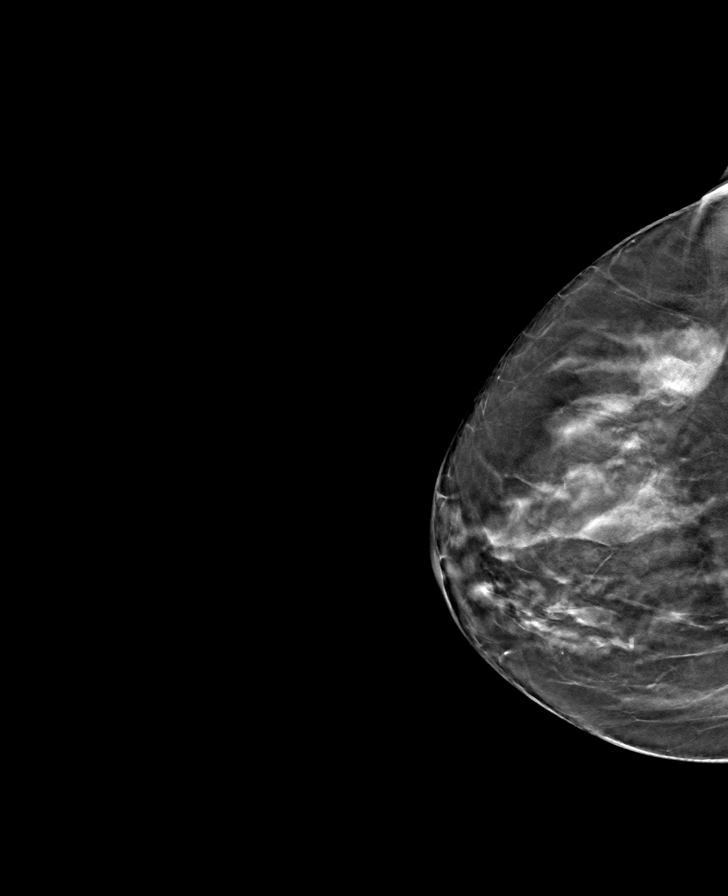

[R MLO tomo · tomo slice 31/61.0]
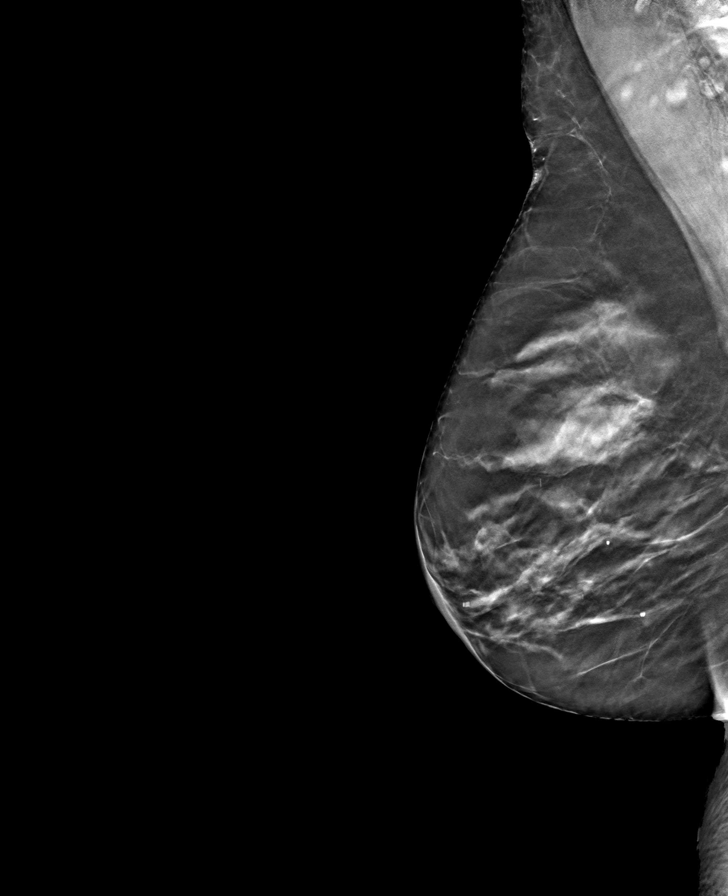

[8 of 24 positions shown; findings below may reference images not displayed]

ACR Breast Density Category c: The breast tissue is heterogeneously
dense, which may obscure small masses.
FINDINGS: There are no findings suspicious for malignancy.
IMPRESSION: No mammographic evidence of malignancy. A result letter of this
screening mammogram will be mailed directly to the patient.

RECOMMENDATION:
Screening mammogram in one year. (Code:[id number])

BI-RADS CATEGORY  1: Negative.

## 2022-02-27 DIAGNOSIS — M2041 Other hammer toe(s) (acquired), right foot: Secondary | ICD-10-CM | POA: Diagnosis not present

## 2022-02-27 DIAGNOSIS — M79675 Pain in left toe(s): Secondary | ICD-10-CM | POA: Diagnosis not present

## 2022-02-27 DIAGNOSIS — M2042 Other hammer toe(s) (acquired), left foot: Secondary | ICD-10-CM | POA: Diagnosis not present

## 2022-02-27 DIAGNOSIS — L84 Corns and callosities: Secondary | ICD-10-CM | POA: Diagnosis not present

## 2022-03-19 DIAGNOSIS — Z7189 Other specified counseling: Secondary | ICD-10-CM | POA: Diagnosis not present

## 2022-03-19 DIAGNOSIS — L814 Other melanin hyperpigmentation: Secondary | ICD-10-CM | POA: Diagnosis not present

## 2022-03-19 DIAGNOSIS — L821 Other seborrheic keratosis: Secondary | ICD-10-CM | POA: Diagnosis not present

## 2022-03-19 DIAGNOSIS — X32XXXA Exposure to sunlight, initial encounter: Secondary | ICD-10-CM | POA: Diagnosis not present

## 2022-03-19 DIAGNOSIS — D2271 Melanocytic nevi of right lower limb, including hip: Secondary | ICD-10-CM | POA: Diagnosis not present

## 2022-03-19 DIAGNOSIS — I8393 Asymptomatic varicose veins of bilateral lower extremities: Secondary | ICD-10-CM | POA: Diagnosis not present

## 2022-03-19 DIAGNOSIS — D2261 Melanocytic nevi of right upper limb, including shoulder: Secondary | ICD-10-CM | POA: Diagnosis not present

## 2022-03-19 DIAGNOSIS — L570 Actinic keratosis: Secondary | ICD-10-CM | POA: Diagnosis not present

## 2022-04-15 DIAGNOSIS — Z124 Encounter for screening for malignant neoplasm of cervix: Secondary | ICD-10-CM | POA: Diagnosis not present

## 2022-04-15 DIAGNOSIS — Z1272 Encounter for screening for malignant neoplasm of vagina: Secondary | ICD-10-CM | POA: Diagnosis not present

## 2022-04-15 DIAGNOSIS — Z6822 Body mass index (BMI) 22.0-22.9, adult: Secondary | ICD-10-CM | POA: Diagnosis not present

## 2022-04-24 ENCOUNTER — Other Ambulatory Visit: Payer: Self-pay | Admitting: Surgery

## 2022-05-02 ENCOUNTER — Ambulatory Visit
Admission: RE | Admit: 2022-05-02 | Discharge: 2022-05-02 | Disposition: A | Discharge status: Home / Self Care | Payer: Medicare HMO | Source: Ambulatory Visit | Attending: Surgery | Admitting: Surgery

## 2022-05-02 DIAGNOSIS — E041 Nontoxic single thyroid nodule: Secondary | ICD-10-CM | POA: Diagnosis not present

## 2022-05-15 ENCOUNTER — Other Ambulatory Visit (HOSPITAL_COMMUNITY): Payer: Self-pay | Admitting: Surgery

## 2022-05-15 ENCOUNTER — Other Ambulatory Visit: Payer: Self-pay | Admitting: Surgery

## 2022-05-28 ENCOUNTER — Encounter (HOSPITAL_COMMUNITY)
Admission: RE | Admit: 2022-05-28 | Discharge: 2022-05-28 | Disposition: A | Discharge status: Home / Self Care | Payer: Medicare HMO | Source: Ambulatory Visit | Attending: Surgery | Admitting: Surgery

## 2022-05-28 DIAGNOSIS — Z8739 Personal history of other diseases of the musculoskeletal system and connective tissue: Secondary | ICD-10-CM | POA: Diagnosis not present

## 2022-05-28 DIAGNOSIS — E041 Nontoxic single thyroid nodule: Secondary | ICD-10-CM | POA: Diagnosis not present

## 2022-05-28 MED ORDER — TECHNETIUM TC 99M SESTAMIBI - CARDIOLITE
25.4000 | Freq: Once | INTRAVENOUS | Status: AC | PRN
  Administered 2022-05-28: 25.4 via INTRAVENOUS

## 2022-06-09 DIAGNOSIS — M25562 Pain in left knee: Secondary | ICD-10-CM | POA: Diagnosis not present

## 2022-06-19 ENCOUNTER — Ambulatory Visit: Payer: Self-pay | Admitting: Surgery

## 2022-06-19 DIAGNOSIS — E210 Primary hyperparathyroidism: Secondary | ICD-10-CM | POA: Diagnosis not present

## 2022-06-26 DIAGNOSIS — M810 Age-related osteoporosis without current pathological fracture: Secondary | ICD-10-CM | POA: Diagnosis not present

## 2022-06-26 DIAGNOSIS — R7303 Prediabetes: Secondary | ICD-10-CM | POA: Diagnosis not present

## 2022-06-26 DIAGNOSIS — E210 Primary hyperparathyroidism: Secondary | ICD-10-CM | POA: Diagnosis not present

## 2022-06-26 DIAGNOSIS — Z8349 Family history of other endocrine, nutritional and metabolic diseases: Secondary | ICD-10-CM | POA: Diagnosis not present

## 2022-06-26 DIAGNOSIS — L659 Nonscarring hair loss, unspecified: Secondary | ICD-10-CM | POA: Diagnosis not present

## 2022-06-26 DIAGNOSIS — Z8639 Personal history of other endocrine, nutritional and metabolic disease: Secondary | ICD-10-CM | POA: Diagnosis not present

## 2022-06-26 DIAGNOSIS — Z8489 Family history of other specified conditions: Secondary | ICD-10-CM | POA: Diagnosis not present

## 2022-07-11 ENCOUNTER — Encounter (HOSPITAL_COMMUNITY): Payer: Self-pay

## 2022-07-11 NOTE — Progress Notes (Signed)
COVID Vaccine Completed:  Date of COVID positive in last 90 days:  PCP - Sela Hilding, MD Cardiologist -   Chest x-ray -  EKG -  Stress Test -  ECHO -  Cardiac Cath -  Pacemaker/ICD device last checked: Spinal Cord Stimulator:  Bowel Prep -   Sleep Study -  CPAP -   Fasting Blood Sugar -  Checks Blood Sugar _____ times a day  Blood Thinner Instructions: Aspirin Instructions: Last Dose:  Activity level:  Can go up a flight of stairs and perform activities of daily living without stopping and without symptoms of chest pain or shortness of breath.  Able to exercise without symptoms  Unable to go up a flight of stairs without symptoms of     Anesthesia review:   Patient denies shortness of breath, fever, cough and chest pain at PAT appointment  Patient verbalized understanding of instructions that were given to them at the PAT appointment. Patient was also instructed that they will need to review over the PAT instructions again at home before surgery.

## 2022-07-11 NOTE — Patient Instructions (Addendum)
SURGICAL WAITING ROOM VISITATION Patients having surgery or a procedure may have no more than 2 support people in the waiting area - these visitors may rotate.   Children under the age of 71 must have an adult with them who is not the patient. If the patient needs to stay at the hospital during part of their recovery, the visitor guidelines for inpatient rooms apply. Pre-op nurse will coordinate an appropriate time for 1 support person to accompany patient in pre-op.  This support person may not rotate.    Please refer to the [facility] website for the visitor guidelines for Inpatients (after your surgery is over and you are in a regular room).      Your procedure is scheduled on: 07-24-22   Report to [facility] Main Entrance    Report to admitting at 5:15 AM   Call this number if you have problems the morning of surgery [phone]   Do not eat food :After Midnight.  After Midnight you may have the following liquids until 4:30 AM DAY OF SURGERY  Water Non-Citrus Juices (without pulp, NO RED) Carbonated Beverages Black Coffee (NO MILK/CREAM OR CREAMERS, sugar ok)  Clear Tea (NO MILK/CREAM OR CREAMERS, sugar ok) regular and decaf                             Plain Jell-O (NO RED)                                           Fruit ices (not with fruit pulp, NO RED)                                     Popsicles (NO RED)                                                               Sports drinks like Gatorade (NO RED)                      If you have questions, please contact your surgeon's office.   FOLLOW  ANY ADDITIONAL PRE OP INSTRUCTIONS YOU RECEIVED FROM YOUR SURGEON'S OFFICE!!!     Oral Hygiene is also important to reduce your risk of infection.                                    Remember - BRUSH YOUR TEETH THE MORNING OF SURGERY WITH YOUR REGULAR TOOTHPASTE   Do NOT smoke after Midnight   Take these medicines the morning of surgery with A SIP OF WATER: Famotidine                               You may not have any metal on your body including hair pins, jewelry, and body piercing             Do not wear make-up, lotions, powders, perfumes or deodorant  Do not wear nail polish including gel and S&S, artificial/acrylic nails, or any other type of covering on natural nails including finger and toenails. If you have artificial nails, gel coating, etc. that needs to be removed by a nail salon please have this removed prior to surgery or surgery may need to be canceled/ delayed if the surgeon/ anesthesia feels like they are unable to be safely monitored.   Do not shave  48 hours prior to surgery.    Do not bring valuables to the hospital. [place].   Contacts, dentures or bridgework may not be worn into surgery.  DO NOT [place]. PHARMACY WILL DISPENSE MEDICATIONS LISTED ON YOUR MEDICATION LIST TO YOU DURING YOUR ADMISSION [place]!   Patients discharged on the day of surgery will not be allowed to drive home.  Someone NEEDS to stay with you for the first 24 hours after anesthesia.  Special Instructions: Bring a copy of your healthcare power of attorney and living will documents the day of surgery if you haven't scanned them before.  Please read over the following fact sheets you were given: IF YOU HAVE QUESTIONS ABOUT YOUR PRE-OP INSTRUCTIONS PLEASE CALL [facility] - Preparing for Surgery Before surgery, you can play an important role.  Because skin is not sterile, your skin needs to be as free of germs as possible.  You can reduce the number of germs on your skin by washing with CHG (chlorahexidine gluconate) soap before surgery.  CHG is an antiseptic cleaner which kills germs and bonds with the skin to continue killing germs even after washing. Please DO NOT use if you have an allergy to CHG or antibacterial soaps.  If your skin becomes reddened/irritated  stop using the CHG and inform your nurse when you arrive at Short Stay. Do not shave (including legs and underarms) for at least 48 hours prior to the first CHG shower.  You may shave your face/neck.  Please follow these instructions carefully:  1.  Shower with CHG Soap the night before surgery and the  morning of surgery.  2.  If you choose to wash your hair, wash your hair first as usual with your normal  shampoo.  3.  After you shampoo, rinse your hair and body thoroughly to remove the shampoo.                             4.  Use CHG as you would any other liquid soap.  You can apply chg directly to the skin and wash.  Gently with a scrungie or clean washcloth.  5.  Apply the CHG Soap to your body ONLY FROM THE NECK DOWN.   Do   not use on face/ open                           Wound or open sores. Avoid contact with eyes, ears mouth and   genitals (private parts).                       Wash face,  Genitals (private parts) with your normal soap.             6.  Wash thoroughly, paying special attention to the area where your    surgery  will be performed.  7.  Thoroughly rinse your body with warm water from the neck down.  8.  DO NOT shower/wash with your normal soap after using and rinsing off the CHG Soap.                9.  Pat yourself dry with a clean towel.            10.  Wear clean pajamas.            11.  Place clean sheets on your bed the night of your first shower and do not  sleep with pets. Day of Surgery : Do not apply any lotions/deodorants the morning of surgery.  Please wear clean clothes to the hospital/surgery center.  FAILURE TO FOLLOW THESE INSTRUCTIONS MAY RESULT IN THE CANCELLATION OF YOUR SURGERY  PATIENT SIGNATURE_________________________________  NURSE SIGNATURE__________________________________  ________________________________________________________________________

## 2022-07-15 ENCOUNTER — Other Ambulatory Visit: Payer: Self-pay

## 2022-07-15 ENCOUNTER — Encounter (HOSPITAL_COMMUNITY)
Admission: RE | Admit: 2022-07-15 | Discharge: 2022-07-15 | Disposition: A | Discharge status: Home / Self Care | Payer: Medicare HMO | Source: Ambulatory Visit | Attending: Surgery | Admitting: Surgery

## 2022-07-15 ENCOUNTER — Encounter (HOSPITAL_COMMUNITY): Payer: Self-pay

## 2022-07-15 DIAGNOSIS — Z01818 Encounter for other preprocedural examination: Secondary | ICD-10-CM | POA: Insufficient documentation

## 2022-07-15 HISTORY — DX: Hyperparathyroidism, unspecified: E21.3

## 2022-07-18 ENCOUNTER — Encounter (HOSPITAL_COMMUNITY): Payer: Self-pay | Admitting: Surgery

## 2022-07-18 DIAGNOSIS — E210 Primary hyperparathyroidism: Secondary | ICD-10-CM | POA: Diagnosis present

## 2022-07-18 NOTE — H&P (Signed)
PROVIDER: Trenell Moxey Charlotta Newton, MD   Chief Complaint: New Consultation (Primary hyperparathyroidism)  History of Present Illness:  Patient is referred by Dr. Johnathan Hausen for surgical evaluation and management of primary hyperparathyroidism. Patient's primary care physician is Dr. Sela Hilding. Patient had been noted for several years to have a mildly elevated serum calcium level. She had originally been diagnosed with osteoporosis in 2006. Calcium levels were followed beginning in 2016. Her most recent laboratory studies from February 2023 show a calcium level of 10.4 and an intact PTH level of 66. Patient had a 24-hour urine collection for calcium performed by Dr. Dagmar Hait with a normal result. Patient denies any history of nephrolithiasis. She denies any significant fatigue. She has had no recent fractures. She has had no prior head or neck surgery. There is no family history of parathyroid disease. Patient underwent an ultrasound examination on May 02, 2022. This showed a normal thyroid gland with a 1.6 cm hypoechoic nodule posterior to the right thyroid lobe. Patient subsequently underwent a nuclear medicine parathyroid scan with sestamibi on May 28, 2022. This localized an area of persistent uptake suspicious for parathyroid adenoma at the same location as the right nodule posterior to the right thyroid lobe. Patient is now referred to surgery for consideration for parathyroidectomy for primary hyperparathyroidism.  Review of Systems: A complete review of systems was obtained from the patient. I have reviewed this information and discussed as appropriate with the patient. See HPI as well for other ROS.  Review of Systems  Constitutional: Negative. Negative for weight loss.  HENT:  Hoarseness  Eyes: Negative.  Respiratory: Negative.  Cardiovascular: Negative.  Gastrointestinal: Negative.  Genitourinary: Positive for frequency.  Musculoskeletal: Positive for joint pain.   Skin: Negative.  Neurological: Negative.  Endo/Heme/Allergies: Negative.  Psychiatric/Behavioral: Negative.   Medical History: Past Medical History:  Diagnosis Date  GERD (gastroesophageal reflux disease)  History of cancer  Hypertension   Patient Active Problem List  Diagnosis  Gastroesophageal reflux disease  Pelvic organ prolapse quantification stage 3 rectocele  Xerophthalmia  Xerostomia  Age-related osteoporosis without current pathological fracture  Bilateral impacted cerumen  Carpal tunnel syndrome  Chronic sinusitis  Hoarseness of voice  Hypercalcemia  Prolapse of female pelvic organs  Pure hypercholesterolemia  Vitamin D deficiency  Primary hyperparathyroidism (CMS-HCC)   Past Surgical History:  Procedure Laterality Date  CHOLECYSTECTOMY  HERNIA REPAIR  HYSTERECTOMY  TONSILLECTOMY    Allergies  Allergen Reactions  Acetaminophen Palpitations  Other reaction(s): palpitations  Povidone-Iodine Rash  Other reaction(s): redness, irritation REACTION: skin irritation REACTION: skin irritation   Current Outpatient Medications on File Prior to Visit  Medication Sig Dispense Refill  BIOTIN ORAL Take by mouth  cholecalciferol, vitD3,/vit K2 (VITAMIN D3-VITAMIN K2 ORAL) Take by mouth  famotidine (PEPCID) 20 MG tablet Take by mouth  ibuprofen (MOTRIN) 600 MG tablet Take 600 mg by mouth every 6 (six) hours as needed for Pain  magnesium oxide (MAG-OX) 400 mg (241.3 mg magnesium) tablet Take 400 mg by mouth once daily  phytonadione, vit K1, (VITAMIN K) 100 mcg tablet Take by mouth  quercetin 500 mg Cap Take by mouth  ubidecarenone (COQ-10 ORAL) Take by mouth  vit A/vit C/vit E/zinc/copper (ICAPS AREDS ORAL) Take by mouth  VITAMIN B COMPLEX ORAL Take 1 tablet by mouth once daily   No current facility-administered medications on file prior to visit.   Family History  Problem Relation Age of Onset  Skin cancer Mother  Diabetes Mother  Skin  cancer Father   Colon cancer Father    Social History   Tobacco Use  Smoking Status Never  Smokeless Tobacco Never    Social History   Socioeconomic History  Marital status: Divorced  Tobacco Use  Smoking status: Never  Smokeless tobacco: Never  Vaping Use  Vaping Use: Never used  Substance and Sexual Activity  Alcohol use: Never  Drug use: Never   Objective:   Vitals:  BP: 126/80  Pulse: 85  Temp: 36.7 C (98.1 F)  SpO2: 98%  Weight: 58.3 kg (128 lb 9.6 oz)  Height: 160 cm ('5\' 3"'$ )   Body mass index is 22.78 kg/m.  Physical Exam   GENERAL APPEARANCE Comfortable, no acute issues Development: normal Gross deformities: none  SKIN Rash, lesions, ulcers: none Induration, erythema: none Nodules: none palpable  EYES Conjunctiva and lids: normal Pupils: equal and reactive  EARS, NOSE, MOUTH, THROAT External ears: no lesion or deformity External nose: no lesion or deformity Hearing: grossly normal  NECK Symmetric: yes Trachea: midline Thyroid: no palpable nodules in the thyroid bed  CHEST Respiratory effort: normal Retraction or accessory muscle use: no Breath sounds: normal bilaterally Rales, rhonchi, wheeze: none  CARDIOVASCULAR Auscultation: regular rhythm, normal rate Murmurs: none Pulses: radial pulse 2+ palpable Lower extremity edema: none  ABDOMEN Not assessed  GENITOURINARY/RECTAL Not assessed  MUSCULOSKELETAL Station and gait: normal Digits and nails: no clubbing or cyanosis Muscle strength: grossly normal all extremities Range of motion: grossly normal all extremities Deformity: none  LYMPHATIC Cervical: none palpable Supraclavicular: none palpable  PSYCHIATRIC Oriented to person, place, and time: yes Mood and affect: normal for situation Judgment and insight: appropriate for situation   Assessment and Plan:   Primary hyperparathyroidism (CMS-HCC)  Patient is referred by Dr. Johnathan Hausen and Dr. Sela Hilding for surgical  evaluation and management of primary hyperparathyroidism.  Patient provided with a copy of "Parathyroid Surgery: Treatment for Your Parathyroid Gland Problem", published by [employer], 12 pages. Book reviewed and explained to patient during visit today.  Patient has biochemical evidence of primary hyperparathyroidism with longstanding mild hypercalcemia and an unsuppressed intact PTH level. Imaging studies including an ultrasound examination and a nuclear medicine parathyroid scan with sestamibi localize a parathyroid adenoma measuring 1.6 cm posterior to the right thyroid lobe.  Today we discussed proceeding with minimally invasive parathyroidectomy as an outpatient surgical procedure. We discussed doing this at [facility]. We discussed the size and location of the surgical incision. We discussed the need for general anesthesia. We discussed the risk and benefits of the surgery including the risk of recurrent laryngeal nerve injury and the risk of recurrent disease due to a second gland adenoma at some point in the future. The patient understands and wishes to proceed with surgery at a time convenient for her in the near future. We discussed her postoperative recovery and return to activities.   Armandina Gemma, MD [facility] [place] practice Office: [phone]

## 2022-07-23 NOTE — Anesthesia Preprocedure Evaluation (Addendum)
Anesthesia Evaluation  Patient identified by MRN, date of birth, ID band Patient awake    Reviewed: Allergy & Precautions, NPO status , Patient's Chart, lab work & pertinent test results  History of Anesthesia Complications (+) PONV  Airway Mallampati: II  TM Distance: >3 FB Neck ROM: Full    Dental  (+) Dental Advisory Given Veneers:   Pulmonary neg pulmonary ROS,    breath sounds clear to auscultation       Cardiovascular negative cardio ROS   Rhythm:Regular Rate:Normal     Neuro/Psych  Headaches,    GI/Hepatic Neg liver ROS, GERD  Medicated and Controlled,  Endo/Other    Renal/GU negative Renal ROS     Musculoskeletal   Abdominal   Peds  Hematology negative hematology ROS (+)   Anesthesia Other Findings   Reproductive/Obstetrics                            Anesthesia Physical Anesthesia Plan  ASA: 2  Anesthesia Plan: General   Post-op Pain Management: Ofirmev IV (intra-op)*   Induction: Intravenous  PONV Risk Score and Plan: 4 or greater and TIVA, Ondansetron, Dexamethasone and Diphenhydramine  Airway Management Planned: Oral ETT  Additional Equipment: None  Intra-op Plan:   Post-operative Plan: Extubation in OR  Informed Consent: I have reviewed the patients History and Physical, chart, labs and discussed the procedure including the risks, benefits and alternatives for the proposed anesthesia with the patient or authorized representative who has indicated his/her understanding and acceptance.     Dental advisory given  Plan Discussed with: CRNA and Surgeon  Anesthesia Plan Comments:       Anesthesia Quick Evaluation

## 2022-07-24 ENCOUNTER — Other Ambulatory Visit: Payer: Self-pay

## 2022-07-24 ENCOUNTER — Ambulatory Visit (HOSPITAL_BASED_OUTPATIENT_CLINIC_OR_DEPARTMENT_OTHER): Payer: Medicare HMO | Admitting: Anesthesiology

## 2022-07-24 ENCOUNTER — Encounter (HOSPITAL_COMMUNITY)
Admission: RE | Disposition: A | Discharge status: Home / Self Care | Payer: Self-pay | Source: Ambulatory Visit | Attending: Surgery

## 2022-07-24 ENCOUNTER — Encounter (HOSPITAL_COMMUNITY): Payer: Self-pay | Admitting: Surgery

## 2022-07-24 ENCOUNTER — Ambulatory Visit (HOSPITAL_COMMUNITY): Payer: Medicare HMO | Admitting: Anesthesiology

## 2022-07-24 ENCOUNTER — Ambulatory Visit (HOSPITAL_COMMUNITY)
Admission: RE | Admit: 2022-07-24 | Discharge: 2022-07-24 | Disposition: A | Discharge status: Home / Self Care | Payer: Medicare HMO | Source: Ambulatory Visit | Attending: Surgery | Admitting: Surgery

## 2022-07-24 DIAGNOSIS — E210 Primary hyperparathyroidism: Secondary | ICD-10-CM | POA: Diagnosis present

## 2022-07-24 DIAGNOSIS — K219 Gastro-esophageal reflux disease without esophagitis: Secondary | ICD-10-CM | POA: Diagnosis not present

## 2022-07-24 DIAGNOSIS — M810 Age-related osteoporosis without current pathological fracture: Secondary | ICD-10-CM | POA: Diagnosis not present

## 2022-07-24 DIAGNOSIS — Z79899 Other long term (current) drug therapy: Secondary | ICD-10-CM | POA: Diagnosis not present

## 2022-07-24 DIAGNOSIS — D351 Benign neoplasm of parathyroid gland: Secondary | ICD-10-CM | POA: Insufficient documentation

## 2022-07-24 HISTORY — PX: PARATHYROIDECTOMY: SHX19

## 2022-07-24 SURGERY — PARATHYROIDECTOMY
Anesthesia: General | Site: Neck | Laterality: Right

## 2022-07-24 MED ORDER — ROCURONIUM BROMIDE 10 MG/ML (PF) SYRINGE
PREFILLED_SYRINGE | INTRAVENOUS | Status: DC | PRN
  Administered 2022-07-24: 50 mg via INTRAVENOUS

## 2022-07-24 MED ORDER — ORAL CARE MOUTH RINSE
15.0000 mL | Freq: Once | OROMUCOSAL | Status: AC
Start: 2022-07-24 — End: 2022-07-24

## 2022-07-24 MED ORDER — PROPOFOL 1000 MG/100ML IV EMUL
INTRAVENOUS | Status: AC
  Filled 2022-07-24: qty 100

## 2022-07-24 MED ORDER — FENTANYL CITRATE (PF) 100 MCG/2ML IJ SOLN
INTRAMUSCULAR | Status: DC | PRN
  Administered 2022-07-24 (×2): 50 ug via INTRAVENOUS

## 2022-07-24 MED ORDER — SUGAMMADEX SODIUM 200 MG/2ML IV SOLN
INTRAVENOUS | Status: DC | PRN
  Administered 2022-07-24: 125 mg via INTRAVENOUS

## 2022-07-24 MED ORDER — BUPIVACAINE HCL 0.25 % IJ SOLN
INTRAMUSCULAR | Status: AC
  Filled 2022-07-24: qty 1

## 2022-07-24 MED ORDER — CHLORHEXIDINE GLUCONATE CLOTH 2 % EX PADS: 6.0000 | MEDICATED_PAD | Freq: Once | CUTANEOUS | Status: DC

## 2022-07-24 MED ORDER — TRAMADOL HCL 50 MG PO TABS: 50.0000 mg | ORAL_TABLET | Freq: Four times a day (QID) | ORAL | 0 refills | Status: DC | PRN

## 2022-07-24 MED ORDER — LACTATED RINGERS IV SOLN: INTRAVENOUS | Status: DC

## 2022-07-24 MED ORDER — PROPOFOL 500 MG/50ML IV EMUL
INTRAVENOUS | Status: DC | PRN
  Administered 2022-07-24: 200 ug/kg/min via INTRAVENOUS

## 2022-07-24 MED ORDER — BUPIVACAINE HCL 0.25 % IJ SOLN
INTRAMUSCULAR | Status: DC | PRN
  Administered 2022-07-24: 10 mL

## 2022-07-24 MED ORDER — HYDROMORPHONE HCL 1 MG/ML IJ SOLN
INTRAMUSCULAR | Status: AC
  Administered 2022-07-24: 0.25 mg via INTRAVENOUS
  Filled 2022-07-24: qty 1

## 2022-07-24 MED ORDER — PROMETHAZINE HCL 25 MG/ML IJ SOLN: 6.2500 mg | INTRAMUSCULAR | Status: DC | PRN

## 2022-07-24 MED ORDER — LIDOCAINE HCL (PF) 2 % IJ SOLN
INTRAMUSCULAR | Status: AC
  Filled 2022-07-24: qty 5

## 2022-07-24 MED ORDER — FENTANYL CITRATE (PF) 100 MCG/2ML IJ SOLN
INTRAMUSCULAR | Status: AC
  Filled 2022-07-24: qty 2

## 2022-07-24 MED ORDER — ACETAMINOPHEN 10 MG/ML IV SOLN
INTRAVENOUS | Status: AC
  Filled 2022-07-24: qty 100

## 2022-07-24 MED ORDER — OXYCODONE HCL 5 MG PO TABS: 5.0000 mg | ORAL_TABLET | Freq: Once | ORAL | Status: DC | PRN

## 2022-07-24 MED ORDER — PROPOFOL 10 MG/ML IV BOLUS
INTRAVENOUS | Status: DC | PRN
  Administered 2022-07-24: 120 mg via INTRAVENOUS

## 2022-07-24 MED ORDER — DEXAMETHASONE SODIUM PHOSPHATE 10 MG/ML IJ SOLN
INTRAMUSCULAR | Status: DC | PRN
  Administered 2022-07-24: 8 mg via INTRAVENOUS

## 2022-07-24 MED ORDER — ONDANSETRON HCL 4 MG/2ML IJ SOLN
INTRAMUSCULAR | Status: DC | PRN
  Administered 2022-07-24: 4 mg via INTRAVENOUS

## 2022-07-24 MED ORDER — DEXAMETHASONE SODIUM PHOSPHATE 10 MG/ML IJ SOLN
INTRAMUSCULAR | Status: AC
  Filled 2022-07-24: qty 1

## 2022-07-24 MED ORDER — HEMOSTATIC AGENTS (NO CHARGE) OPTIME
TOPICAL | Status: DC | PRN
  Administered 2022-07-24: 1

## 2022-07-24 MED ORDER — OXYCODONE HCL 5 MG/5ML PO SOLN: 5.0000 mg | Freq: Once | ORAL | Status: DC | PRN

## 2022-07-24 MED ORDER — MEPERIDINE HCL 50 MG/ML IJ SOLN: 6.2500 mg | INTRAMUSCULAR | Status: DC | PRN

## 2022-07-24 MED ORDER — HYDROMORPHONE HCL 1 MG/ML IJ SOLN
0.2500 mg | INTRAMUSCULAR | Status: DC | PRN
  Administered 2022-07-24: 0.25 mg via INTRAVENOUS

## 2022-07-24 MED ORDER — LIDOCAINE 2% (20 MG/ML) 5 ML SYRINGE
INTRAMUSCULAR | Status: DC | PRN
  Administered 2022-07-24: 20 mg via INTRAVENOUS

## 2022-07-24 MED ORDER — ACETAMINOPHEN 10 MG/ML IV SOLN
INTRAVENOUS | Status: DC | PRN
  Administered 2022-07-24: 1000 mg via INTRAVENOUS

## 2022-07-24 MED ORDER — CEFAZOLIN SODIUM-DEXTROSE 2-4 GM/100ML-% IV SOLN
2.0000 g | INTRAVENOUS | Status: AC
  Administered 2022-07-24: 2 g via INTRAVENOUS
  Filled 2022-07-24: qty 100

## 2022-07-24 MED ORDER — 0.9 % SODIUM CHLORIDE (POUR BTL) OPTIME
TOPICAL | Status: DC | PRN
  Administered 2022-07-24: 1000 mL

## 2022-07-24 MED ORDER — ONDANSETRON HCL 4 MG/2ML IJ SOLN
INTRAMUSCULAR | Status: AC
  Filled 2022-07-24: qty 2

## 2022-07-24 MED ORDER — MIDAZOLAM HCL 2 MG/2ML IJ SOLN: 0.5000 mg | Freq: Once | INTRAMUSCULAR | Status: DC | PRN

## 2022-07-24 MED ORDER — ROCURONIUM BROMIDE 10 MG/ML (PF) SYRINGE
PREFILLED_SYRINGE | INTRAVENOUS | Status: AC
  Filled 2022-07-24: qty 10

## 2022-07-24 MED ORDER — CHLORHEXIDINE GLUCONATE 0.12 % MT SOLN
15.0000 mL | Freq: Once | OROMUCOSAL | Status: AC
  Administered 2022-07-24: 15 mL via OROMUCOSAL

## 2022-07-24 SURGICAL SUPPLY — 36 items
ADH SKN CLS APL DERMABOND .7 (GAUZE/BANDAGES/DRESSINGS) ×1
APL PRP STRL LF DISP 70% ISPRP (MISCELLANEOUS) ×1
ATTRACTOMAT 16X20 MAGNETIC DRP (DRAPES) ×2 IMPLANT
BAG COUNTER SPONGE SURGICOUNT (BAG) ×2 IMPLANT
BAG SPNG CNTER NS LX DISP (BAG) ×1
BLADE SURG 15 STRL LF DISP TIS (BLADE) ×2 IMPLANT
BLADE SURG 15 STRL SS (BLADE) ×1
CHLORAPREP W/TINT 26 (MISCELLANEOUS) ×2 IMPLANT
CLIP TI MEDIUM 6 (CLIP) ×4 IMPLANT
CLIP TI WIDE RED SMALL 6 (CLIP) ×4 IMPLANT
COVER SURGICAL LIGHT HANDLE (MISCELLANEOUS) ×2 IMPLANT
DERMABOND IMPLANT
DERMABOND ADVANCED .7 DNX12 (GAUZE/BANDAGES/DRESSINGS) ×2 IMPLANT
DRAPE LAPAROTOMY T 98X78 PEDS (DRAPES) ×2 IMPLANT
DRAPE UTILITY XL STRL (DRAPES) ×2 IMPLANT
ELECT REM PT RETURN 15FT ADLT (MISCELLANEOUS) ×2 IMPLANT
GAUZE 4X4 16PLY ~~LOC~~+RFID DBL (SPONGE) ×2 IMPLANT
GLOVE SURG ORTHO 8.0 STRL STRW (GLOVE) ×2 IMPLANT
GOWN STRL REUS W/ TWL XL LVL3 (GOWN DISPOSABLE) ×6 IMPLANT
GOWN STRL REUS W/TWL XL LVL3 (GOWN DISPOSABLE) ×3
HEMOSTAT SURGICEL 2X4 FIBR (HEMOSTASIS) ×2 IMPLANT
ILLUMINATOR WAVEGUIDE N/F (MISCELLANEOUS) IMPLANT
KIT BASIN OR (CUSTOM PROCEDURE TRAY) ×2 IMPLANT
KIT TURNOVER KIT A (KITS) IMPLANT
NDL HYPO 25X1 1.5 SAFETY (NEEDLE) ×2 IMPLANT
NEEDLE HYPO 25X1 1.5 SAFETY (NEEDLE) ×1 IMPLANT
PACK BASIC VI WITH GOWN DISP (CUSTOM PROCEDURE TRAY) ×2 IMPLANT
PENCIL SMOKE EVACUATOR (MISCELLANEOUS) ×2 IMPLANT
SHEARS HARMONIC 9CM CVD (BLADE) IMPLANT
SUT MNCRL AB 4-0 PS2 18 (SUTURE) ×2 IMPLANT
SUT VIC AB 3-0 SH 18 (SUTURE) ×2 IMPLANT
SYR BULB IRRIG 60ML STRL (SYRINGE) ×2 IMPLANT
SYR CONTROL 10ML LL (SYRINGE) ×2 IMPLANT
TOWEL OR 17X26 10 PK STRL BLUE (TOWEL DISPOSABLE) ×2 IMPLANT
TOWEL OR NON WOVEN STRL DISP B (DISPOSABLE) ×2 IMPLANT
TUBING CONNECTING 10 (TUBING) ×2 IMPLANT

## 2022-07-24 NOTE — Anesthesia Procedure Notes (Signed)
Procedure Name: Intubation Date/Time: 07/24/2022 7:40 AM  Performed by: Jerilynn Feldmeier D, CRNAPre-anesthesia Checklist: Patient identified, Emergency Drugs available, Suction available and Patient being monitored Patient Re-evaluated:Patient Re-evaluated prior to induction Oxygen Delivery Method: Circle system utilized Preoxygenation: Pre-oxygenation with 100% oxygen Induction Type: IV induction Ventilation: Mask ventilation without difficulty Laryngoscope Size: Mac and 3 Grade View: Grade II Tube type: Oral Tube size: 7.0 mm Number of attempts: 2 Airway Equipment and Method: Stylet Placement Confirmation: ETT inserted through vocal cords under direct vision, positive ETCO2 and breath sounds checked- equal and bilateral Secured at: 21 cm Tube secured with: Tape Dental Injury: Teeth and Oropharynx as per pre-operative assessment

## 2022-07-24 NOTE — Interval H&P Note (Signed)
History and Physical Interval Note:  07/24/2022 7:02 AM  Alexandria Hudson  has presented today for surgery, with the diagnosis of PRIMARY HYPERPARATHYROIDISM.  The various methods of treatment have been discussed with the patient and family. After consideration of risks, benefits and other options for treatment, the patient has consented to    Procedure(s): RIGHT PARATHYROIDECTOMY (Right) as a surgical intervention.    The patient's history has been reviewed, patient examined, no change in status, stable for surgery.  I have reviewed the patient's chart and labs.  Questions were answered to the patient's satisfaction.    Armandina Gemma, [facility] [place] practice Office: [place]

## 2022-07-24 NOTE — Anesthesia Postprocedure Evaluation (Signed)
Anesthesia Post Note  Patient: Alexandria Hudson  Procedure(s) Performed: RIGHT PARATHYROIDECTOMY (Right: Neck)     Patient location during evaluation: PACU Anesthesia Type: General Level of consciousness: awake and alert, patient cooperative and oriented Pain management: pain level controlled Vital Signs Assessment: post-procedure vital signs reviewed and stable Respiratory status: spontaneous breathing, nonlabored ventilation and respiratory function stable Cardiovascular status: blood pressure returned to baseline and stable Postop Assessment: no apparent nausea or vomiting Anesthetic complications: no   No notable events documented.  Last Vitals:  Vitals:   07/24/22 0915 07/24/22 0930  BP: (!) 155/93 (!) 160/86  Pulse: 62 61  Resp: 12 11  Temp:    SpO2: 96% 98%    Last Pain:  Vitals:   07/24/22 0930  TempSrc:   PainSc: 0-No pain                 Jessalyn Hinojosa,E. Rebeccah Ivins

## 2022-07-24 NOTE — Discharge Instructions (Signed)
[facility] - Dr. Bellah Alia  THYROID & PARATHYROID SURGERY:  POST-OP INSTRUCTIONS  Always review the instruction sheet provided by the hospital nurse at discharge.  A prescription for pain medication may be sent to your pharmacy at the time of discharge.  Take your pain medication as prescribed.  If narcotic pain medicine is not needed, then you may take acetaminophen (Tylenol) or ibuprofen (Advil) as needed for pain or soreness.  Take your normal home medications as prescribed unless otherwise directed.  If you need a refill on your pain medication, please contact the office during regular business hours.  Prescriptions will not be processed by the office after 5:00PM or on weekends.  Start with a light diet upon arrival home, such as soup and crackers or toast.  Be sure to drink plenty of fluids.  Resume your normal diet the day after surgery.  Most patients will experience some swelling and bruising on the chest and neck area.  Ice packs will help for the first 48 hours after arriving home.  Swelling and bruising will take several days to resolve.   It is common to experience some constipation after surgery.  Increasing fluid intake and taking a stool softener (Colace) will usually help to prevent this problem.  A mild laxative (Milk of Magnesia or Miralax) should be taken according to package directions if there has been no bowel movement after 48 hours.  Dermabond glue covers your incision. This seals the wound and you may shower at any time. The Dermabond will remain in place for about a week.  You may gradually remove the glue when it loosens around the edges.  If you need to loosen the Dermabond for removal, apply a layer of Vaseline to the wound for 15 minutes and then remove with a Kleenex. Your sutures are under the skin and will not show - they will dissolve on their own.  You may resume light daily activities beginning the day after discharge (such as self-care,  walking, climbing stairs), gradually increasing activities as tolerated. You may have sexual intercourse when it is comfortable. Refrain from any heavy lifting or straining until approved by your doctor. You may drive when you no longer are taking prescription pain medication, you can comfortably wear a seatbelt, and you can safely maneuver your car and apply the brakes.  You will see your doctor in the office for a follow-up appointment approximately three weeks after your surgery.  Make sure that you call for this appointment within a day or two after you arrive home to insure a convenient appointment time. Please have any requested laboratory tests performed a few days prior to your office visit so that the results will be available at your follow up appointment.  WHEN TO CALL THE [facility] OFFICE: -- Fever greater than 101.5 -- Inability to urinate -- Nausea and/or vomiting - persistent -- Extreme swelling or bruising -- Continued bleeding from incision -- Increased pain, redness, or drainage from the incision -- Difficulty swallowing or breathing -- Muscle cramping or spasms -- Numbness or tingling in hands or around lips  The clinic staff is available to answer your questions during regular business hours.  Please don't hesitate to call and ask to speak to one of the nurses if you have concerns.  [facility] OFFICE: [phone] (24 hours)  Please sign up for MyChart accounts. This will allow you to communicate directly with my nurse or myself without having to call the office. It will also allow you   to view your test results. You will need to enroll in MyChart for my office ([facility]) and for the hospital ([facility]).  Verlon Pischke, MD [facility] practice 

## 2022-07-24 NOTE — Transfer of Care (Signed)
Immediate Anesthesia Transfer of Care Note  Patient: Alexandria Hudson  Procedure(s) Performed: RIGHT PARATHYROIDECTOMY (Right: Neck)  Patient Location: PACU  Anesthesia Type:General  Level of Consciousness: awake, alert  and oriented  Airway & Oxygen Therapy: Patient Spontanous Breathing and Patient connected to face mask oxygen  Post-op Assessment: Report given to RN and Post -op Vital signs reviewed and stable  Post vital signs: Reviewed and stable  Last Vitals:  Vitals Value Taken Time  BP 148/90 07/24/22 0849  Temp    Pulse 77 07/24/22 0850  Resp 9 07/24/22 0850  SpO2 100 % 07/24/22 0850  Vitals shown include unvalidated device data.  Last Pain:  Vitals:   07/24/22 0659  TempSrc:   PainSc: 0-No pain         Complications: No notable events documented.

## 2022-07-24 NOTE — Op Note (Signed)
OPERATIVE REPORT - PARATHYROIDECTOMY  Preoperative diagnosis: Primary hyperparathyroidism  Postop diagnosis: Same  Procedure: Right superior minimally invasive parathyroidectomy  Surgeon:  Armandina Gemma, MD  Anesthesia: General endotracheal  Estimated blood loss: Minimal  Preparation: ChloraPrep  Indications: Patient is referred by Dr. Johnathan Hausen for surgical evaluation and management of primary hyperparathyroidism. Patient's primary care physician is Dr. Sela Hilding. Patient had been noted for several years to have a mildly elevated serum calcium level. She had originally been diagnosed with osteoporosis in 2006. Calcium levels were followed beginning in 2016. Her most recent laboratory studies from February 2023 show a calcium level of 10.4 and an intact PTH level of 66. Patient had a 24-hour urine collection for calcium performed by Dr. Dagmar Hait with a normal result. Patient denies any history of nephrolithiasis. She denies any significant fatigue. She has had no recent fractures. She has had no prior head or neck surgery. There is no family history of parathyroid disease. Patient underwent an ultrasound examination on May 02, 2022. This showed a normal thyroid gland with a 1.6 cm hypoechoic nodule posterior to the right thyroid lobe. Patient subsequently underwent a nuclear medicine parathyroid scan with sestamibi on May 28, 2022. This localized an area of persistent uptake suspicious for parathyroid adenoma at the same location as the right nodule posterior to the right thyroid lobe. Patient is now referred to surgery for consideration for parathyroidectomy for primary hyperparathyroidism.  Procedure: The patient was prepared in the pre-operative holding area. The patient was brought to the operating room and placed in a supine position on the operating room table. Following administration of general anesthesia, the patient was positioned and then prepped and draped in the usual  strict aseptic fashion. After ascertaining that an adequate level of anesthesia been achieved, a neck incision was made with a #15 blade. Dissection was carried through subcutaneous tissues and platysma. Hemostasis was obtained with the electrocautery. Skin flaps were developed circumferentially and a Weitlander retractor was placed for exposure.  Strap muscles were incised in the midline. Strap muscles were reflected laterally exposing the right thyroid lobe. With gentle blunt dissection the thyroid lobe was mobilized.  Dissection was carried posteriorly and an enlarged parathyroid gland was identified posteriorly adjacent to the esophagus. It was gently mobilized. Vascular structures were divided between small ligaclips. Care was taken to avoid the recurrent laryngeal nerve which was under the anterior portion of the gland. The parathyroid gland was completely excised. It was submitted to pathology where frozen section confirmed hypercellular parathyroid tissue consistent with adenoma.  Neck was irrigated with warm saline and good hemostasis was noted. Fibrillar was placed in the operative field. Strap muscles were approximated in the midline with interrupted 3-0 Vicryl sutures. Platysma was closed with interrupted 3-0 Vicryl sutures. Marcaine was infiltrated circumferentially. Skin was closed with a running 4-0 Monocryl subcuticular suture. Wound was washed and dried and Dermabond was applied. Patient was awakened from anesthesia and brought to the recovery room. The patient tolerated the procedure well.   Armandina Gemma, [facility] Office: [phone]

## 2022-07-25 ENCOUNTER — Encounter (HOSPITAL_COMMUNITY): Payer: Self-pay | Admitting: Surgery

## 2022-07-25 LAB — SURGICAL PATHOLOGY

## 2022-07-31 NOTE — Progress Notes (Signed)
     Patient has a parathyroid adenoma which was removed.  Semantics.  Armandina Gemma, MD [facility] [place] [facility] Office: [phone]

## 2022-08-13 DIAGNOSIS — H04123 Dry eye syndrome of bilateral lacrimal glands: Secondary | ICD-10-CM | POA: Diagnosis not present

## 2022-08-18 DIAGNOSIS — E892 Postprocedural hypoparathyroidism: Secondary | ICD-10-CM | POA: Diagnosis not present

## 2022-08-18 DIAGNOSIS — E210 Primary hyperparathyroidism: Secondary | ICD-10-CM | POA: Diagnosis not present

## 2022-09-12 ENCOUNTER — Telehealth: Payer: Self-pay | Admitting: *Deleted

## 2022-09-12 ENCOUNTER — Encounter: Payer: Self-pay | Admitting: *Deleted

## 2022-09-12 NOTE — Patient Instructions (Signed)
Visit Information  Thank you for taking time to visit with me today. Please don't hesitate to contact me if I can be of assistance to you.   Following are the goals we discussed today:   Goals Addressed               This Visit's Progress     COMPLETED: No needs (pt-stated)        Care Coordination Interventions: Reviewed medications with patient and discussed adherence with all medications with no refills Reviewed scheduled/upcoming provider appointments including pending appointments Screening for signs and symptoms of depression related to chronic disease state  Assessed social determinant of health barriers           Please call the care guide team at [phone] if you need to cancel or reschedule your appointment.   If you are experiencing a Mental Health or [place] or need someone to talk to, please call the Suicide and Crisis Lifeline: 988  Patient verbalizes understanding of instructions and care plan provided today and agrees to view in [place]. Active MyChart status and patient understanding of how to access instructions and care plan via MyChart confirmed with patient.     No further follow up required: No needs    Raina Mina, RN Care Management Coordinator [place] Office [phone]

## 2022-09-12 NOTE — Patient Outreach (Signed)
  Care Coordination   Initial Visit Note   09/12/2022 Name: Alexandria Hudson MRN: [id number] DOB: [birth year]  Alexandria Hudson is a 76 y.o. year old female who sees Lindell Noe, Anastasia Pall, MD for primary care. I spoke with  Gae Gallop by phone today.  What matters to the patients health and wellness today?  No needs    Goals Addressed               This Visit's Progress     COMPLETED: No needs (pt-stated)        Care Coordination Interventions: Reviewed medications with patient and discussed adherence with all medications with no refills Reviewed scheduled/upcoming provider appointments including pending appointments Screening for signs and symptoms of depression related to chronic disease state  Assessed social determinant of health barriers          SDOH assessments and interventions completed:  Yes  SDOH Interventions Today    Flowsheet Row Most Recent Value  SDOH Interventions   Food Insecurity Interventions Intervention Not Indicated  Housing Interventions Intervention Not Indicated  Transportation Interventions Intervention Not Indicated  Utilities Interventions Intervention Not Indicated        Care Coordination Interventions Activated:  Yes  Care Coordination Interventions:  Yes, provided   Follow up plan: No further intervention required.   Encounter Outcome:  Pt. Visit Completed   Raina Mina, RN Care Management Coordinator [place] Office [phone]

## 2022-09-29 ENCOUNTER — Other Ambulatory Visit: Payer: Medicare HMO

## 2022-09-29 ENCOUNTER — Encounter: Payer: Self-pay | Admitting: Gastroenterology

## 2022-09-29 ENCOUNTER — Ambulatory Visit: Payer: Medicare HMO | Admitting: Gastroenterology

## 2022-09-29 VITALS — BP 110/70 | HR 65 | Ht 63.0 in | Wt 132.0 lb

## 2022-09-29 DIAGNOSIS — L570 Actinic keratosis: Secondary | ICD-10-CM | POA: Diagnosis not present

## 2022-09-29 DIAGNOSIS — K580 Irritable bowel syndrome with diarrhea: Secondary | ICD-10-CM | POA: Diagnosis not present

## 2022-09-29 DIAGNOSIS — Z86010 Personal history of colonic polyps: Secondary | ICD-10-CM

## 2022-09-29 DIAGNOSIS — L821 Other seborrheic keratosis: Secondary | ICD-10-CM | POA: Diagnosis not present

## 2022-09-29 DIAGNOSIS — R140 Abdominal distension (gaseous): Secondary | ICD-10-CM

## 2022-09-29 DIAGNOSIS — X32XXXA Exposure to sunlight, initial encounter: Secondary | ICD-10-CM | POA: Diagnosis not present

## 2022-09-29 MED ORDER — RIFAXIMIN 550 MG PO TABS: 550.0000 mg | ORAL_TABLET | Freq: Three times a day (TID) | ORAL | 0 refills | Status: AC

## 2022-09-29 NOTE — Progress Notes (Signed)
HPI :  76 year old female here for a follow-up visit for irritable bowel syndrome, excessive gas and bloating etc.  I last saw her in September 2022.  At that point time she had a surveillance colonoscopy, noted to have a sessile serrated polyp that invaded her appendix he was sent for an appendectomy.  She had appendectomy with surgery in January of this year.  Pathology shows sessile serrated polyp although unclear if clear margin or not based on description.  Main reason she is here today is for altered bowel habits with extreme gas and bloating.  This is been ongoing for some time.  Recall she had a history of cholecystectomy in the past.  She has frequent loose stools although form can vary.  She has usually 4 bowel movements per day.  She does have some discomfort in her abdomen at times which can be relieved with a bowel movement.  She has excessive flatulence and bloating which really bothers her.  I previously gave her a trial of low FODMAP diet and she did not think it helped too much.  She has particular food she is sensitive to such as salads etc.  Eating a salad will often result in frequent loose stool.  Of note looking at her medication she takes a magnesium supplement 100 mg every day.  She also is using ibuprofen every night for carpal tunnel.  No weight loss.  Otherwise feels well without complaints.  She had questions about her colon polyps and surveillance intervals for her next exam, and how long to do future colonoscopies.  She has not been tried on to many medical therapies yet for her bowels.  She does not smoke cigarettes, no family history of colon cancer.  Her sister did have bile duct cancer in 2019 .  Otherwise remarkable for her medical history is that she underwent parathyroidectomy since have last seen her for hypercalcemia and she states this went well for her.  Endoscopic history:  Colonoscopy 01/18/20 - The perianal and digital rectal examinations were normal. - Two  flat polyps were found in the cecum. The polyps were 3 to 7 mm in size. These polyps were removed with a cold snare. Resection and retrieval were complete. - Five flat and sessile polyps were found in the ascending colon. The polyps were 3 to 8 mm in size. These polyps were removed with a cold snare, one of which could only be grasped and removed in the retroflexed position. Resection and retrieval were complete. - Four flat polyps were found in the transverse colon. The polyps were 3 to 12 mm in size. These polyps were removed with a cold snare. Resection and retrieval were complete. - Two flat polyps were found in the splenic flexure. The polyps were 4 to 6 mm in size. These polyps were removed with a cold snare. Resection and retrieval were complete. - A 4 mm polyp was found in the descending colon. The polyp was flat. The polyp was removed with a cold snare. Resection and retrieval were complete. - Many medium-mouthed diverticula were found in the transverse colon and left colon. - Internal hemorrhoids were found during retroflexion. The hemorrhoids were moderate. - The exam was otherwise without abnormality.   Surgical [P], colon, transverse, cecum and ascending, splenic flexure, descending, polyp (14) - SESSILE SERRATED POLYP (MULTIPLE FRAGMENTS). - HYPERPLASTIC POLYP. - NO DYSPLASIA OR MALIGNANCY.     Colonoscopy 07/26/21: - The perianal and digital rectal examinations were normal. - The terminal ileum appeared  normal. - A polypoid lesion was found intermittently prolapsing out of the appendiceal orifice. Initially the AO appeared normal but then a polypoid flat lesion prolapsed out of it, could not see clear margins, and appeared to invade the appendix, suspect sessile serrated lesion. Biopsies were taken with a cold forceps for histology. - A 3 to 4 mm polyp was found in the transverse colon. The polyp was flat. The polyp was removed with a cold snare. Resection and retrieval  were complete. - Multiple medium-mouthed diverticula were found in the entire colon. - Internal hemorrhoids were found during retroflexion. - The exam was otherwise without abnormality.  1. Surgical [P], appendiceal orifice polyp biopsies - SESSILE SERRATED POLYP. - NO DYSPLASIA OR MALIGNANCY. 2. Surgical [P], colon, transverse, polyp (1) - BENIGN COLONIC MUCOSA WITH LYMPHOID AGGREGATE. - NO DYPLASIA OR MALIGNANCY.  Led to appendectomy 11/2021 FINAL MICROSCOPIC DIAGNOSIS:   A. APPENDIX, APPENDECTOMY:  - Appendix with a benign serrated polyp  - Negative for cytologic dysplasia or malignancy      Past Medical History:  Diagnosis Date   Cancer ([place])    skin cancer   Carpal tunnel syndrome    BOTH WRISTS    Cataracts, both eyes    Family history of adverse reaction to anesthesia    sister problems with N/V   GERD (gastroesophageal reflux disease)    Headache    MIGRAINES   Hyperparathyroidism ([place])    Osteoporosis    Pneumonia    as a child   PONV (postoperative nausea and vomiting)    NO NAUSEA SINCE TUBES TIED     Past Surgical History:  Procedure Laterality Date   ABDOMINAL HYSTERECTOMY  38 YRS AGO   PARTIAL   ANTERIOR AND POSTERIOR REPAIR WITH SACROSPINOUS FIXATION N/A 01/03/2019   Procedure: ANTERIOR AND POSTERIOR REPAIR WITH SACROSPINOUS FIXATION;  Surgeon: Linda Hedges, DO;  Location: [facility];  Service: Gynecology;  Laterality: N/A;   APPENDECTOMY     CATARACT EXTRACTION, BILATERAL     CHOLECYSTECTOMY  1986   COLONOSCOPY     HERNIA REPAIR  1993   INGUINAL   LAPAROSCOPIC APPENDECTOMY N/A 12/06/2021   Procedure: APPENDECTOMY LAPAROSCOPIC;  Surgeon: Johnathan Hausen, MD;  Location: [facility];  Service: General;  Laterality: N/A;   PARATHYROIDECTOMY Right 07/24/2022   Procedure: RIGHT PARATHYROIDECTOMY;  Surgeon: Armandina Gemma, MD;  Location: [facility];  Service: General;  Laterality: Right;   SKIN CANCER AREAS REMOVED     TONSILLECTOMY     TUBAL  LIGATION     Family History  Problem Relation Age of Onset   Crohn's disease Father    Esophageal cancer Neg Hx    Rectal cancer Neg Hx    Stomach cancer Neg Hx    Breast cancer Neg Hx    Colon cancer Neg Hx    Social History   Tobacco Use   Smoking status: Never   Smokeless tobacco: Never  Vaping Use   Vaping Use: Never used  Substance Use Topics   Alcohol use: Yes    Comment: OCC   Drug use: Never   Current Outpatient Medications  Medication Sig Dispense Refill   B Complex-C (B-COMPLEX WITH VITAMIN C) tablet Take 2 tablets by mouth daily.     BIOTIN PO Take 1 tablet by mouth daily.     Cholecalciferol (VITAMIN D3) 50 MCG (2000 UT) TABS Take 1,000 Units by mouth every other day.     Coenzyme Q10 (COQ10 PO) Take 100 mg  by mouth daily.     famotidine (PEPCID) 20 MG tablet Take 20 mg by mouth daily as needed for heartburn or indigestion.     hydrocortisone 2.5 % cream Apply 1 Application topically as needed for hemorrhoids.     ibuprofen (ADVIL) 200 MG tablet Take 200-400 mg by mouth at bedtime as needed (pain.).     MAGNESIUM PO Take 1 tablet by mouth 4 (four) times a week.     OVER THE COUNTER MEDICATION Take 6 oz by mouth daily. ASEA Redox Cell Signaling Supplement     Polyethyl Glycol-Propyl Glycol (LUBRICANT EYE DROPS) 0.4-0.3 % SOLN Place 1-2 drops into both eyes 3 (three) times daily as needed (dry/irritated eyes.).     Quercetin 250 MG TABS Take 500 mg by mouth 3 (three) times a week.     vitamin C (ASCORBIC ACID) 250 MG tablet Take 250 mg by mouth daily.     VITAMIN K PO Take 1 tablet by mouth every other day. With vitamin d3     Acetylcysteine (NAC PO) Take 1 tablet by mouth 3 (three) times a week. (Patient not taking: Reported on 09/29/2022)     No current facility-administered medications for this visit.   Allergies  Allergen Reactions   Betadine [Povidone-Iodine] Other (See Comments)    skin irritation   Tylenol [Acetaminophen] Palpitations     Review of  Systems: All systems reviewed and negative except where noted in HPI.   Lab Results  Component Value Date   WBC 6.7 11/18/2021   HGB 13.4 11/18/2021   HCT 42.3 11/18/2021   MCV 94.2 11/18/2021   PLT 272 11/18/2021    Lab Results  Component Value Date   CREATININE 0.61 01/07/2019   BUN 10 01/07/2019   NA 135 01/07/2019   K 4.1 01/07/2019   CL 103 01/07/2019   CO2 24 01/07/2019    Lab Results  Component Value Date   ALT 17 01/04/2019   AST 19 01/04/2019   ALKPHOS 67 01/04/2019   BILITOT 0.8 01/04/2019     Physical Exam: BP 110/70   Pulse 65   Ht '5\' 3"'$  (1.6 m)   Wt 132 lb (59.9 kg)   BMI 23.38 kg/m  Constitutional: Pleasant,well-developed, female in no acute distress. Neurological: Alert and oriented to person place and time. Psychiatric: Normal mood and affect. Behavior is normal.   ASSESSMENT: 76 y.o. female here for assessment of the following  1. Irritable bowel syndrome with diarrhea   2. Bloating   3. History of colon polyps    History of cholecystectomy.  Stable symptoms over some time.  I think she likely has IBS-D/functional bowel disorder.  Her colonoscopy otherwise looked okay.  Discussed options with her.  She has never been tested for celiac disease so we will send serologies to make sure negative.  She is taking a magnesium supplement daily which can cause loose stools, I asked her to hold off on this for a few weeks to see if it helps.  She should also stop routine NSAID use as well.  Otherwise discussed treatment for IBS-D, discussed options.  I think with her significant gas and bloating, rifaximin may be a very good option for her.  I will prescribe 550 mg 3 times daily for 2 weeks and see if this helps.  If this provides some benefit we will monitor and await her course.  If it does not help her and symptoms persist then we may try her on some Colestid  given her history of cholecystectomy.  We discussed both of these options and she is comfortable  starting rifaximin first.  I think this will help her bloating.  I will otherwise get her basic labs from her primary care's office to make sure stable as I do not see recent labs in the system for her.  Finally she does have a history of numerous colon polyps in the past.  She had a sessile serrated polyp invading her appendix on the last colonoscopy and underwent appendectomy.  It is difficult to say on pathology result if she had a clear margin for this.  Assuming the margin is clear then she is not due for colonoscopy until September 2025.  We will reach out to pathology to confirm the surgical margin is negative.   PLAN: - lab today for TTG IgA and total IgA - stop magnesium supplement - stop NSAIDs - prescribe Rifaximin '550mg'$  TID for 2 weeks - if Rifaximin not covered by insurance or does not help, will then will try Colestid - labs from her PCP Dr. Lindell Noe to make sure stable - follow up surgical pathology to ensure clear margin on appendiceal polyp. If negative, not due for colonoscopy until 2025  Jolly Mango, MD [facility]

## 2022-09-29 NOTE — Patient Instructions (Addendum)
If you are age 75 or older, your body mass index should be between 23-30. Your Body mass index is 23.38 kg/m. If this is out of the aforementioned range listed, please consider follow up with your Primary Care Provider.  If you are age 68 or younger, your body mass index should be between 19-25. Your Body mass index is 23.38 kg/m. If this is out of the aformentioned range listed, please consider follow up with your Primary Care Provider.   ________________________________________________________   Please go to the lab in the basement of our building to have lab work done as you leave today. Hit "B" for basement when you get on the elevator.  When the doors open the lab is on your left.  We will call you with the results. Thank you.  Stop magnesium and all NSAIDs.  We will request your records from Dr. Lindell Noe.  We have sent the following medications to your pharmacy for you to pick up at your convenience: Xifaxan 550 mg: Take three times a day for 2 weeks  Thank you for entrusting me with your care and for choosing [employer], Dr. Bluefield Cellar

## 2022-09-30 ENCOUNTER — Other Ambulatory Visit (HOSPITAL_COMMUNITY): Payer: Self-pay

## 2022-10-01 LAB — IGA: Immunoglobulin A: 309 mg/dL (ref 70–320)

## 2022-10-01 LAB — TISSUE TRANSGLUTAMINASE, IGA: (tTG) Ab, IgA: <1 U/mL

## 2022-10-02 ENCOUNTER — Telehealth: Payer: Self-pay | Admitting: Pharmacy Technician

## 2022-10-02 ENCOUNTER — Other Ambulatory Visit (HOSPITAL_COMMUNITY): Payer: Self-pay

## 2022-10-02 NOTE — Telephone Encounter (Signed)
Patient Advocate Encounter  Prior Authorization for XIFAXAN '550MG'$  has been approved.    PA# [id number] Effective dates: 1.1.23 through 12.31.24  Lorre Opdahl B. CPhT P: [phone] F: [phone]   Received notification from [facility] that prior authorization for [facility] '550MG'$  is required.   PA submitted on 11.23.23 Key [id number] Status is pending    Luciano Cutter, CPhT Patient Advocate Phone: [phone]

## 2022-10-06 ENCOUNTER — Telehealth: Payer: Self-pay | Admitting: Gastroenterology

## 2022-10-06 NOTE — Telephone Encounter (Signed)
Called and spoke with patient. Pt is aware that medication had been approved but her co-pay was approx. $782. I told pt that we do have a sample course for her. Pt states that she will try to stop by the 3rd floor tomorrow to pick up the samples. Pt knows to contact us if her symptoms persist despite this regimen. Pt verbalized understanding and had no concerns at the end of the call.

## 2022-10-06 NOTE — Telephone Encounter (Signed)
Sorry to hear this.  Not sure if any free samples available for her? If not, could then try colestid 1gm BID for 1 month and see if that works. Thanks

## 2022-10-06 NOTE — Telephone Encounter (Signed)
Spoke with Dr. Vic Ripper about pathology results from her appendectomy. The polyp was small and there was a clear margin, no residual polyp tissue remains based on the pathology result.   She is next due for a colonoscopy in 07/2024.  [place] can you send the patient a Mychart message letting her know her pathology result showed a clear margin and timing of her next colonoscopy, and place a recall in the system for her exam? Thanks

## 2022-10-06 NOTE — Telephone Encounter (Signed)
Dr. Havery Moros, would you like to try an alternative treatment?

## 2022-10-07 DIAGNOSIS — H04123 Dry eye syndrome of bilateral lacrimal glands: Secondary | ICD-10-CM | POA: Diagnosis not present

## 2022-10-07 NOTE — Telephone Encounter (Signed)
07/2024 colonoscopy recall in epic. MyChart message sent to patient with results and recommendations as outlined below.

## 2022-10-09 NOTE — Telephone Encounter (Signed)
Patient viewed MyChart message. Last read by Gae Gallop at 11:27 AM on 10/09/2022.

## 2022-10-14 ENCOUNTER — Ambulatory Visit (HOSPITAL_COMMUNITY)
Admission: RE | Admit: 2022-10-14 | Discharge: 2022-10-14 | Disposition: A | Discharge status: Home / Self Care | Payer: Medicare HMO | Source: Ambulatory Visit | Attending: Internal Medicine | Admitting: Internal Medicine

## 2022-10-14 ENCOUNTER — Other Ambulatory Visit (HOSPITAL_COMMUNITY): Payer: Self-pay | Admitting: Internal Medicine

## 2022-10-14 DIAGNOSIS — H35363 Drusen (degenerative) of macula, bilateral: Secondary | ICD-10-CM | POA: Diagnosis not present

## 2022-10-14 DIAGNOSIS — G5600 Carpal tunnel syndrome, unspecified upper limb: Secondary | ICD-10-CM | POA: Diagnosis not present

## 2022-10-14 DIAGNOSIS — R0602 Shortness of breath: Secondary | ICD-10-CM | POA: Diagnosis not present

## 2022-10-14 DIAGNOSIS — E785 Hyperlipidemia, unspecified: Secondary | ICD-10-CM | POA: Diagnosis not present

## 2022-10-14 DIAGNOSIS — Z8639 Personal history of other endocrine, nutritional and metabolic disease: Secondary | ICD-10-CM | POA: Diagnosis not present

## 2022-10-14 DIAGNOSIS — M810 Age-related osteoporosis without current pathological fracture: Secondary | ICD-10-CM | POA: Diagnosis not present

## 2022-10-14 DIAGNOSIS — R7303 Prediabetes: Secondary | ICD-10-CM | POA: Diagnosis not present

## 2022-10-14 DIAGNOSIS — Z8489 Family history of other specified conditions: Secondary | ICD-10-CM | POA: Diagnosis not present

## 2022-10-14 DIAGNOSIS — Z9889 Other specified postprocedural states: Secondary | ICD-10-CM | POA: Diagnosis not present

## 2022-10-16 ENCOUNTER — Other Ambulatory Visit: Payer: Self-pay | Admitting: Internal Medicine

## 2022-10-16 DIAGNOSIS — M810 Age-related osteoporosis without current pathological fracture: Secondary | ICD-10-CM

## 2022-11-19 ENCOUNTER — Other Ambulatory Visit: Payer: Self-pay | Admitting: Family Medicine

## 2022-11-19 DIAGNOSIS — Z1231 Encounter for screening mammogram for malignant neoplasm of breast: Secondary | ICD-10-CM

## 2022-12-03 DIAGNOSIS — G5602 Carpal tunnel syndrome, left upper limb: Secondary | ICD-10-CM | POA: Diagnosis not present

## 2022-12-08 ENCOUNTER — Ambulatory Visit
Admission: RE | Admit: 2022-12-08 | Discharge: 2022-12-08 | Disposition: A | Discharge status: Home / Self Care | Payer: Medicare HMO | Source: Ambulatory Visit | Attending: Family Medicine | Admitting: Family Medicine

## 2022-12-08 DIAGNOSIS — Z1231 Encounter for screening mammogram for malignant neoplasm of breast: Secondary | ICD-10-CM

## 2022-12-08 DIAGNOSIS — R9431 Abnormal electrocardiogram [ECG] [EKG]: Secondary | ICD-10-CM | POA: Diagnosis not present

## 2022-12-08 DIAGNOSIS — E559 Vitamin D deficiency, unspecified: Secondary | ICD-10-CM | POA: Diagnosis not present

## 2022-12-08 DIAGNOSIS — E210 Primary hyperparathyroidism: Secondary | ICD-10-CM | POA: Diagnosis not present

## 2022-12-08 DIAGNOSIS — Z0000 Encounter for general adult medical examination without abnormal findings: Secondary | ICD-10-CM | POA: Diagnosis not present

## 2022-12-08 DIAGNOSIS — R7301 Impaired fasting glucose: Secondary | ICD-10-CM | POA: Diagnosis not present

## 2022-12-08 DIAGNOSIS — K219 Gastro-esophageal reflux disease without esophagitis: Secondary | ICD-10-CM | POA: Diagnosis not present

## 2022-12-08 DIAGNOSIS — M810 Age-related osteoporosis without current pathological fracture: Secondary | ICD-10-CM | POA: Diagnosis not present

## 2022-12-08 DIAGNOSIS — E7800 Pure hypercholesterolemia, unspecified: Secondary | ICD-10-CM | POA: Diagnosis not present

## 2022-12-08 DIAGNOSIS — Z79899 Other long term (current) drug therapy: Secondary | ICD-10-CM | POA: Diagnosis not present

## 2023-01-08 DIAGNOSIS — G5602 Carpal tunnel syndrome, left upper limb: Secondary | ICD-10-CM | POA: Diagnosis not present

## 2023-01-09 ENCOUNTER — Ambulatory Visit: Payer: Medicare HMO

## 2023-01-28 DIAGNOSIS — G5602 Carpal tunnel syndrome, left upper limb: Secondary | ICD-10-CM | POA: Diagnosis not present

## 2023-03-09 DIAGNOSIS — Z9889 Other specified postprocedural states: Secondary | ICD-10-CM | POA: Diagnosis not present

## 2023-03-09 DIAGNOSIS — H1089 Other conjunctivitis: Secondary | ICD-10-CM | POA: Diagnosis not present

## 2023-03-25 DIAGNOSIS — D2271 Melanocytic nevi of right lower limb, including hip: Secondary | ICD-10-CM | POA: Diagnosis not present

## 2023-03-25 DIAGNOSIS — R208 Other disturbances of skin sensation: Secondary | ICD-10-CM | POA: Diagnosis not present

## 2023-03-25 DIAGNOSIS — L820 Inflamed seborrheic keratosis: Secondary | ICD-10-CM | POA: Diagnosis not present

## 2023-03-25 DIAGNOSIS — L821 Other seborrheic keratosis: Secondary | ICD-10-CM | POA: Diagnosis not present

## 2023-03-25 DIAGNOSIS — Z7189 Other specified counseling: Secondary | ICD-10-CM | POA: Diagnosis not present

## 2023-03-25 DIAGNOSIS — L570 Actinic keratosis: Secondary | ICD-10-CM | POA: Diagnosis not present

## 2023-03-25 DIAGNOSIS — X32XXXA Exposure to sunlight, initial encounter: Secondary | ICD-10-CM | POA: Diagnosis not present

## 2023-03-25 DIAGNOSIS — L814 Other melanin hyperpigmentation: Secondary | ICD-10-CM | POA: Diagnosis not present

## 2023-03-25 DIAGNOSIS — D2261 Melanocytic nevi of right upper limb, including shoulder: Secondary | ICD-10-CM | POA: Diagnosis not present

## 2023-04-20 DIAGNOSIS — Z01419 Encounter for gynecological examination (general) (routine) without abnormal findings: Secondary | ICD-10-CM | POA: Diagnosis not present

## 2023-04-20 DIAGNOSIS — Z6822 Body mass index (BMI) 22.0-22.9, adult: Secondary | ICD-10-CM | POA: Diagnosis not present

## 2023-04-21 DIAGNOSIS — K219 Gastro-esophageal reflux disease without esophagitis: Secondary | ICD-10-CM | POA: Diagnosis not present

## 2023-07-20 ENCOUNTER — Ambulatory Visit
Admission: RE | Admit: 2023-07-20 | Discharge: 2023-07-20 | Disposition: A | Discharge status: Home / Self Care | Payer: Medicare HMO | Source: Ambulatory Visit | Attending: Internal Medicine | Admitting: Internal Medicine

## 2023-07-20 DIAGNOSIS — M8588 Other specified disorders of bone density and structure, other site: Secondary | ICD-10-CM | POA: Diagnosis not present

## 2023-07-20 DIAGNOSIS — M810 Age-related osteoporosis without current pathological fracture: Secondary | ICD-10-CM

## 2023-07-20 DIAGNOSIS — N958 Other specified menopausal and perimenopausal disorders: Secondary | ICD-10-CM | POA: Diagnosis not present

## 2023-07-20 DIAGNOSIS — E349 Endocrine disorder, unspecified: Secondary | ICD-10-CM | POA: Diagnosis not present

## 2023-07-21 DIAGNOSIS — G5602 Carpal tunnel syndrome, left upper limb: Secondary | ICD-10-CM | POA: Diagnosis not present

## 2023-08-18 DIAGNOSIS — Z8781 Personal history of (healed) traumatic fracture: Secondary | ICD-10-CM | POA: Diagnosis not present

## 2023-08-18 DIAGNOSIS — Z9889 Other specified postprocedural states: Secondary | ICD-10-CM | POA: Diagnosis not present

## 2023-08-18 DIAGNOSIS — G5600 Carpal tunnel syndrome, unspecified upper limb: Secondary | ICD-10-CM | POA: Diagnosis not present

## 2023-08-18 DIAGNOSIS — Z8489 Family history of other specified conditions: Secondary | ICD-10-CM | POA: Diagnosis not present

## 2023-08-18 DIAGNOSIS — Z8639 Personal history of other endocrine, nutritional and metabolic disease: Secondary | ICD-10-CM | POA: Diagnosis not present

## 2023-08-18 DIAGNOSIS — M810 Age-related osteoporosis without current pathological fracture: Secondary | ICD-10-CM | POA: Diagnosis not present

## 2023-08-18 DIAGNOSIS — R7303 Prediabetes: Secondary | ICD-10-CM | POA: Diagnosis not present

## 2023-09-30 ENCOUNTER — Encounter: Payer: Self-pay | Admitting: Podiatry

## 2023-09-30 ENCOUNTER — Ambulatory Visit: Payer: Medicare HMO | Admitting: Podiatry

## 2023-09-30 DIAGNOSIS — M205X2 Other deformities of toe(s) (acquired), left foot: Secondary | ICD-10-CM

## 2023-09-30 DIAGNOSIS — Z0100 Encounter for examination of eyes and vision without abnormal findings: Secondary | ICD-10-CM | POA: Diagnosis not present

## 2023-09-30 DIAGNOSIS — L84 Corns and callosities: Secondary | ICD-10-CM

## 2023-09-30 NOTE — Progress Notes (Signed)
Chief Complaint  Patient presents with   Toe Pain    Left 5th toe adductovarus digit with interdigital callus, adjacent lesion to 4th toe   HPI: 77 y.o. female presents today with painful corn to the left fourth interspace.  She notes that she is going on a trip to [place] soon and will be doing a lot of walking and does not want this to be an aggravating factor.  Past Medical History:  Diagnosis Date   Cancer ([facility])    skin cancer   Carpal tunnel syndrome    BOTH WRISTS    Cataracts, both eyes    Family history of adverse reaction to anesthesia    sister problems with N/V   GERD (gastroesophageal reflux disease)    Headache    MIGRAINES   Hyperparathyroidism ([facility])    Osteoporosis    Pneumonia    as a child   PONV (postoperative nausea and vomiting)    NO NAUSEA SINCE TUBES TIED    Past Surgical History:  Procedure Laterality Date   ABDOMINAL HYSTERECTOMY  38 YRS AGO   PARTIAL   ANTERIOR AND POSTERIOR REPAIR WITH SACROSPINOUS FIXATION N/A 01/03/2019   Procedure: ANTERIOR AND POSTERIOR REPAIR WITH SACROSPINOUS FIXATION;  Surgeon: Mitchel Honour, DO;  Location: [facility];  Service: Gynecology;  Laterality: N/A;   APPENDECTOMY     CATARACT EXTRACTION, BILATERAL     CHOLECYSTECTOMY  1986   COLONOSCOPY     HERNIA REPAIR  1993   INGUINAL   LAPAROSCOPIC APPENDECTOMY N/A 12/06/2021   Procedure: APPENDECTOMY LAPAROSCOPIC;  Surgeon: Luretha Murphy, MD;  Location[facility];  Service: General;  Laterality: N/A;   PARATHYROIDECTOMY Right 07/24/2022   Procedure: RIGHT PARATHYROIDECTOMY;  Surgeon: Darnell Level, MD;  Location[facility];  Service: General;  Laterality: Right;   SKIN CANCER AREAS REMOVED     TONSILLECTOMY     TUBAL LIGATION      Allergies  Allergen Reactions   Betadine [Povidone-Iodine] Other (See Comments)    skin irritation   Tylenol [Acetaminophen] Palpitations    Physical Exam: Palpable pedal pulses left foot.  There is adductovarus  rotation to the left fourth and fifth toes.  There is a moderate size hyperkeratotic lesion on the medial aspect of the fifth toe DIPJ and lateral aspect of the fourth toe PIPJ consistent with "kissing corns".  No signs of infection or abscess are noted.  Epicritic sensation is intact.  Assessment/Plan of Care: 1. Adductovarus rotation of toe, acquired, left   2. Corns    Discussed clinical findings with patient today.  The hyperkeratotic lesions were shaved with a sterile scalpel blade.  Salinocaine was applied to the fifth toe lesion area post shaving.  Dispensed mini gel double body toe splint and toe caps to try for comfort.  Also recommended in gingerly toe socks to wear on her trip to [place].  Recommended she get the thinner, liner version of the sock for comfort and to create a buffer between the skin of the 2 toes that are rubbing together.  Follow-up as needed  Clerance Lav, DPM, FACFAS [facility]     [address], [place] [address]  Office [phone]  Fax [phone]

## 2023-11-23 DIAGNOSIS — J209 Acute bronchitis, unspecified: Secondary | ICD-10-CM | POA: Diagnosis not present

## 2023-12-16 DIAGNOSIS — E7800 Pure hypercholesterolemia, unspecified: Secondary | ICD-10-CM | POA: Diagnosis not present

## 2023-12-16 DIAGNOSIS — E559 Vitamin D deficiency, unspecified: Secondary | ICD-10-CM | POA: Diagnosis not present

## 2023-12-16 DIAGNOSIS — Z0000 Encounter for general adult medical examination without abnormal findings: Secondary | ICD-10-CM | POA: Diagnosis not present

## 2023-12-16 DIAGNOSIS — M810 Age-related osteoporosis without current pathological fracture: Secondary | ICD-10-CM | POA: Diagnosis not present

## 2023-12-16 DIAGNOSIS — K219 Gastro-esophageal reflux disease without esophagitis: Secondary | ICD-10-CM | POA: Diagnosis not present

## 2023-12-16 DIAGNOSIS — R7301 Impaired fasting glucose: Secondary | ICD-10-CM | POA: Diagnosis not present

## 2023-12-16 DIAGNOSIS — R0982 Postnasal drip: Secondary | ICD-10-CM | POA: Diagnosis not present

## 2023-12-16 DIAGNOSIS — Z79899 Other long term (current) drug therapy: Secondary | ICD-10-CM | POA: Diagnosis not present

## 2023-12-25 ENCOUNTER — Other Ambulatory Visit: Payer: Self-pay | Admitting: Family Medicine

## 2023-12-25 DIAGNOSIS — Z1231 Encounter for screening mammogram for malignant neoplasm of breast: Secondary | ICD-10-CM

## 2024-01-05 DIAGNOSIS — Z8639 Personal history of other endocrine, nutritional and metabolic disease: Secondary | ICD-10-CM | POA: Diagnosis not present

## 2024-01-05 DIAGNOSIS — R7303 Prediabetes: Secondary | ICD-10-CM | POA: Diagnosis not present

## 2024-01-05 DIAGNOSIS — Z8489 Family history of other specified conditions: Secondary | ICD-10-CM | POA: Diagnosis not present

## 2024-01-05 DIAGNOSIS — Z8781 Personal history of (healed) traumatic fracture: Secondary | ICD-10-CM | POA: Diagnosis not present

## 2024-01-05 DIAGNOSIS — Z9889 Other specified postprocedural states: Secondary | ICD-10-CM | POA: Diagnosis not present

## 2024-01-05 DIAGNOSIS — M810 Age-related osteoporosis without current pathological fracture: Secondary | ICD-10-CM | POA: Diagnosis not present

## 2024-01-05 DIAGNOSIS — E785 Hyperlipidemia, unspecified: Secondary | ICD-10-CM | POA: Diagnosis not present

## 2024-01-06 ENCOUNTER — Ambulatory Visit
Admission: RE | Admit: 2024-01-06 | Discharge: 2024-01-06 | Disposition: A | Discharge status: Home / Self Care | Payer: Medicare Other | Source: Ambulatory Visit | Attending: Family Medicine

## 2024-01-06 DIAGNOSIS — Z1231 Encounter for screening mammogram for malignant neoplasm of breast: Secondary | ICD-10-CM | POA: Diagnosis not present

## 2024-01-11 DIAGNOSIS — L570 Actinic keratosis: Secondary | ICD-10-CM | POA: Diagnosis not present

## 2024-03-01 DIAGNOSIS — M7989 Other specified soft tissue disorders: Secondary | ICD-10-CM | POA: Diagnosis not present

## 2024-03-01 DIAGNOSIS — M79661 Pain in right lower leg: Secondary | ICD-10-CM | POA: Diagnosis not present

## 2024-03-01 DIAGNOSIS — I8312 Varicose veins of left lower extremity with inflammation: Secondary | ICD-10-CM | POA: Diagnosis not present

## 2024-03-01 DIAGNOSIS — I8311 Varicose veins of right lower extremity with inflammation: Secondary | ICD-10-CM | POA: Diagnosis not present

## 2024-03-01 DIAGNOSIS — I83893 Varicose veins of bilateral lower extremities with other complications: Secondary | ICD-10-CM | POA: Diagnosis not present

## 2024-03-01 DIAGNOSIS — I83813 Varicose veins of bilateral lower extremities with pain: Secondary | ICD-10-CM | POA: Diagnosis not present

## 2024-03-25 DIAGNOSIS — R300 Dysuria: Secondary | ICD-10-CM | POA: Diagnosis not present

## 2024-04-18 ENCOUNTER — Encounter: Payer: Self-pay | Admitting: Physical Therapy

## 2024-04-18 ENCOUNTER — Other Ambulatory Visit: Payer: Self-pay

## 2024-04-18 ENCOUNTER — Ambulatory Visit: Attending: Family | Admitting: Physical Therapy

## 2024-04-18 DIAGNOSIS — M6281 Muscle weakness (generalized): Secondary | ICD-10-CM | POA: Insufficient documentation

## 2024-04-18 DIAGNOSIS — R278 Other lack of coordination: Secondary | ICD-10-CM | POA: Diagnosis not present

## 2024-04-18 NOTE — Therapy (Signed)
 OUTPATIENT PHYSICAL THERAPY FEMALE PELVIC EVALUATION   Patient Name: Alexandria Hudson MRN: [id number] DOB:1946-04-18, 78 y.o., female Today's Date: 04/18/2024  END OF SESSION:  PT End of Session - 04/18/24 1116     Visit Number 1    Date for PT Re-Evaluation 07/11/24    Authorization Type [employer] medicare    Authorization - Visit Number 1    Authorization - Number of Visits 10    PT Start Time 1110    PT Stop Time 1140    PT Time Calculation (min) 30 min    Activity Tolerance Patient tolerated treatment well    Behavior During Therapy WFL for tasks assessed/performed             Past Medical History:  Diagnosis Date   Cancer (HCC)    skin cancer   Carpal tunnel syndrome    BOTH WRISTS    Cataracts, both eyes    Family history of adverse reaction to anesthesia    sister problems with N/V   GERD (gastroesophageal reflux disease)    Headache    MIGRAINES   Hyperparathyroidism (HCC)    Osteoporosis    Pneumonia    as a child   PONV (postoperative nausea and vomiting)    NO NAUSEA SINCE TUBES TIED   Past Surgical History:  Procedure Laterality Date   ABDOMINAL HYSTERECTOMY  38 YRS AGO   PARTIAL   ANTERIOR AND POSTERIOR REPAIR WITH SACROSPINOUS FIXATION N/A 01/03/2019   Procedure: ANTERIOR AND POSTERIOR REPAIR WITH SACROSPINOUS FIXATION;  Surgeon: Dyanna Glasgow, DO;  Location: [facility];  Service: Gynecology;  Laterality: N/A;   APPENDECTOMY     CATARACT EXTRACTION, BILATERAL     CHOLECYSTECTOMY  1986   COLONOSCOPY     HERNIA REPAIR  1993   INGUINAL   LAPAROSCOPIC APPENDECTOMY N/A 12/06/2021   Procedure: APPENDECTOMY LAPAROSCOPIC;  Surgeon: Jacolyn Matar, MD;  Location: [facility];  Service: General;  Laterality: N/A;   PARATHYROIDECTOMY Right 07/24/2022   Procedure: RIGHT PARATHYROIDECTOMY;  Surgeon: Oralee Billow, MD;  Location: [facility];  Service: General;  Laterality: Right;   SKIN CANCER AREAS REMOVED     TONSILLECTOMY     TUBAL LIGATION      Patient Active Problem List   Diagnosis Date Noted   Hyperparathyroidism, primary (HCC) 07/18/2022   Bilateral impacted cerumen 12/27/2019   Prolapse of female pelvic organs 01/03/2019   Pelvic organ prolapse quantification stage 3 rectocele 01/03/2019   Chronic sinusitis 02/10/2017   Gastroesophageal reflux disease 02/10/2017   Hoarseness of voice 02/10/2017   Xerophthalmia 02/10/2017   Xerostomia 02/10/2017    PCP: Ransom Byers, MD  REFERRING PROVIDER: Cornelia Dieter, FNP   REFERRING DIAG: [phone] (ICD-10-CM) - Post-void dribbling   THERAPY DIAG:  Muscle weakness (generalized) - Plan: PT plan of care cert/re-cert  Other lack of coordination - Plan: PT plan of care cert/re-cert  Rationale for Evaluation and Treatment: Rehabilitation  ONSET DATE: 1/25  SUBJECTIVE:  SUBJECTIVE STATEMENT: I noticed the bladder is falling down. After she urinates she will have a drip of urine in her panties.  Fluid intake: water, soda, coffee  PAIN:  Are you having pain? No  PRECAUTIONS: Other: skin cancer  RED FLAGS: None   WEIGHT BEARING RESTRICTIONS: No  FALLS:  Has patient fallen in last 6 months? Yes. Number of falls fell over her daughters dog but not due to balance  OCCUPATION: retire  ACTIVITY LEVEL : walks her dogs and stays busy at home; She lives with daughter  PLOF: Independent  PATIENT GOALS: avoid surgery  PERTINENT HISTORY:  Parathyroidectomy 07/24/22; Laparoscopic appendectomy 12/06/21; Hernia repair 1993; cholecystectomy 1986; anterior and posterior repair with sacrospinous fixation 2020; Osteoporosis; skin cancer  BOWEL MOVEMENT: none  URINATION: Pain with urination: No Fully empty bladder: No will have to urinate again after urinating Stream: average Urgency:  Yes , able to get to the bathroom in time Frequency: average Leakage: just after urinating Pads: No  INTERCOURSE:   Ability to have vaginal penetration Yes  Pain with intercourse: none DrynessYes  Marinoff Scale: 0/3  PREGNANCY: Vaginal deliveries 2 Tearing Yes: after second child she had to have repair in the vaginal area due to the tear   PROLAPSE: Bulge   OBJECTIVE:  Note: Objective measures were completed at Evaluation unless otherwise noted.  DIAGNOSTIC FINDINGS:  none  PATIENT SURVEYS:  UDI-6 ; 25  COGNITION: Overall cognitive status: Within functional limits for tasks assessed     SENSATION: Light touch: Appears intact  POSTURE: No Significant postural limitations   LUMBARAROM/PROM:Lumbar ROM is full   LOWER EXTREMITY ROM:  Passive ROM Right eval Left eval  Hip internal rotation 5 5   (Blank rows = not tested)  LOWER EXTREMITY MMT:  MMT Right eval Left eval  Hip flexion 4/5 4/5  Hip extension 4/5 4/5  Hip abduction 3/5 4/5   (Blank rows = not tested) PALPATION:   Abdominal: Decreased movement of the lower rib cage; contract the abdomen and bulge the lower abdomen                External Perineal Exam: decreased mobility of the perineal body                             Internal Pelvic Floor: tightness in the sides of the introitus along the posterior vaginal wall   Patient confirms identification and approves PT to assess internal pelvic floor and treatment Yes No emotional/communication barriers or cognitive limitation. Patient is motivated to learn. Patient understands and agrees with treatment goals and plan. PT explains patient will be examined in standing, sitting, and lying down to see how their muscles and joints work. When they are ready, they will be asked to remove their underwear so PT can examine their perineum. The patient is also given the option of providing their own chaperone as one is not provided in our facility. The patient  also has the right and is explained the right to defer or refuse any part of the evaluation or treatment including the internal exam. With the patient's consent, PT will use one gloved finger to gently assess the muscles of the pelvic floor, seeing how well it contracts and relaxes and if there is muscle symmetry. After, the patient will get dressed and PT and patient will discuss exam findings and plan of care. PT and patient discuss plan of care, schedule, attendance policy and  HEP activities.   PELVIC MMT:   MMT eval  Vaginal 2/5   (Blank rows = not tested)        TONE: average  PROLAPSE: Anterior wall weakness   TODAY'S TREATMENT:                                                                                                                              DATE: 04/18/24  EVAL Examination completed, findings reviewed, pt educated on POC, HEP, and female pelvic floor anatomy, reasoning with pelvic floor assessment internally with pt consent. Pt motivated to participate in PT and agreeable to attempt recommendations. She has weakness in her hips. She will bulge her lower abdomen when contracting. She has decreased movement of the lower rib cage.     PATIENT EDUCATION:  Education details: educated patient on the findings from the eval, what to expect in her future visits and discussed goals Person educated: Patient Education method: Explanation, Demonstration, Tactile cues, Verbal cues, and Handouts Education comprehension: verbalized understanding, returned demonstration, verbal cues required, tactile cues required, and needs further education  HOME EXERCISE PROGRAM: See above  ASSESSMENT:  CLINICAL IMPRESSION: Patient is a 77 y.o. female who was seen today for physical therapy evaluation and treatment for post void dribbling. Patient reports she is feeling a bulge in the vaginal area. She will leak urine after she pulls up her underwear from urinating. Pelvic floor strength is 2/5. She  has tightness along the posterior vaginal canal, sides of the introitus, and perineal body. She has a cornicle on the urethral opening. She will bulge her lower abdomen as she contracts them. She has decreased lower rib cage mobility. Patient will benefit from skilled therapy to improve pelvic floor contraction for post void dribbling and pressure management to reduce anterior wall weakness.   OBJECTIVE IMPAIRMENTS: decreased coordination, decreased endurance, decreased strength, and increased fascial restrictions.   ACTIVITY LIMITATIONS: continence and toileting  PARTICIPATION LIMITATIONS: shopping and community activity  PERSONAL FACTORS: Parathyroidectomy 07/24/22; Laparoscopic appendectomy 12/06/21; Hernia repair 1993; cholecystectomy 1986; anterior and posterior repair with sacrospinous fixation 2020; Osteoporosis; skin cancer are also affecting patient's functional outcome.   REHAB POTENTIAL: Excellent  CLINICAL DECISION MAKING: Evolving/moderate complexity  EVALUATION COMPLEXITY: Moderate   GOALS: Goals reviewed with patient? Yes  SHORT TERM GOALS: Target date: 05/16/24  Patient independent with initial HEP for pelvic floor and core strength.  Baseline: Goal status: INITIAL  2.  Patient able to open up her lower rib cage for diaphragmatic breathing to work on relaxation of the pelvic floor.  Baseline:  Goal status: INITIAL  3.  Patient understands how to perform double void to reduce the urinary leakage after urinating.  Baseline:  Goal status: INITIAL   LONG TERM GOALS: Target date: 07/11/24  Patient independent with advanced HEP  Baseline:  Goal status: INITIAL  2.  Patient understands prolapse management to prevent further progression of the anterior wall weakness.  Baseline:  Goal  status: INITIAL  3.  Pelvic floor strength is >/= 3/5 holding for 10 seconds to reduce post void dribbling and urine to go on her underwear causing a smell.  Baseline:  Goal status:  INITIAL  4.  Patient reports she feels the bulge in the vaginal area 50% less due to improved strength and reduce chance of having surgery.  Baseline:  Goal status: INITIAL   PLAN:  PT FREQUENCY: 1-2x/week  PT DURATION: 12 weeks  PLANNED INTERVENTIONS: 97110-Therapeutic exercises, 97530- Therapeutic activity, 97112- Neuromuscular re-education, 97535- Self Care, 09604- Manual therapy, 20560 (1-2 muscles), 20561 (3+ muscles)- Dry Needling, Patient/Family education, Joint mobilization, Spinal mobilization, Cryotherapy, Moist heat, and Biofeedback  PLAN FOR NEXT SESSION: manual work on abdomen to work on diaphragmatic breathing, manual work to pelvic floor to work on tissue mobility; prolapse management; hip internal rotation  Marsha Skeen, PT 04/18/24 5:10 PM

## 2024-04-27 ENCOUNTER — Ambulatory Visit: Admitting: Physical Therapy

## 2024-04-27 ENCOUNTER — Encounter: Payer: Self-pay | Admitting: Physical Therapy

## 2024-04-27 DIAGNOSIS — M6281 Muscle weakness (generalized): Secondary | ICD-10-CM

## 2024-04-27 DIAGNOSIS — R278 Other lack of coordination: Secondary | ICD-10-CM

## 2024-04-27 NOTE — Therapy (Signed)
 OUTPATIENT PHYSICAL THERAPY FEMALE PELVIC TREATMENT   Patient Name: Alexandria Hudson MRN: [id number] DOB:Apr 02, 1946, 78 y.o., female Today's Date: 04/27/2024  END OF SESSION:  PT End of Session - 04/27/24 1527     Visit Number 2    Date for PT Re-Evaluation 07/11/24    Authorization Type [employer] medicare    Authorization - Visit Number 2    Authorization - Number of Visits 10    PT Start Time 1530    PT Stop Time 1610    PT Time Calculation (min) 40 min    Activity Tolerance Patient tolerated treatment well    Behavior During Therapy WFL for tasks assessed/performed          Past Medical History:  Diagnosis Date   Cancer (HCC)    skin cancer   Carpal tunnel syndrome    BOTH WRISTS    Cataracts, both eyes    Family history of adverse reaction to anesthesia    sister problems with N/V   GERD (gastroesophageal reflux disease)    Headache    MIGRAINES   Hyperparathyroidism (HCC)    Osteoporosis    Pneumonia    as a child   PONV (postoperative nausea and vomiting)    NO NAUSEA SINCE TUBES TIED   Past Surgical History:  Procedure Laterality Date   ABDOMINAL HYSTERECTOMY  38 YRS AGO   PARTIAL   ANTERIOR AND POSTERIOR REPAIR WITH SACROSPINOUS FIXATION N/A 01/03/2019   Procedure: ANTERIOR AND POSTERIOR REPAIR WITH SACROSPINOUS FIXATION;  Surgeon: Dyanna Glasgow, DO;  Location: [facility];  Service: Gynecology;  Laterality: N/A;   APPENDECTOMY     CATARACT EXTRACTION, BILATERAL     CHOLECYSTECTOMY  1986   COLONOSCOPY     HERNIA REPAIR  1993   INGUINAL   LAPAROSCOPIC APPENDECTOMY N/A 12/06/2021   Procedure: APPENDECTOMY LAPAROSCOPIC;  Surgeon: Jacolyn Matar, MD;  Location: [facility];  Service: General;  Laterality: N/A;   PARATHYROIDECTOMY Right 07/24/2022   Procedure: RIGHT PARATHYROIDECTOMY;  Surgeon: Oralee Billow, MD;  Location: [facility];  Service: General;  Laterality: Right;   SKIN CANCER AREAS REMOVED     TONSILLECTOMY     TUBAL LIGATION     Patient  Active Problem List   Diagnosis Date Noted   Hyperparathyroidism, primary (HCC) 07/18/2022   Bilateral impacted cerumen 12/27/2019   Prolapse of female pelvic organs 01/03/2019   Pelvic organ prolapse quantification stage 3 rectocele 01/03/2019   Chronic sinusitis 02/10/2017   Gastroesophageal reflux disease 02/10/2017   Hoarseness of voice 02/10/2017   Xerophthalmia 02/10/2017   Xerostomia 02/10/2017    PCP: Ransom Byers, MD  REFERRING PROVIDER: Cornelia Dieter, FNP   REFERRING DIAG: [phone] (ICD-10-CM) - Post-void dribbling   THERAPY DIAG:  Muscle weakness (generalized)  Other lack of coordination  Rationale for Evaluation and Treatment: Rehabilitation  ONSET DATE: 1/25  SUBJECTIVE:  SUBJECTIVE STATEMENT: No changes.  Fluid intake: water, soda, coffee  PAIN:  Are you having pain? No  PRECAUTIONS: Other: skin cancer  RED FLAGS: None   WEIGHT BEARING RESTRICTIONS: No  FALLS:  Has patient fallen in last 6 months? Yes. Number of falls fell over her daughters dog but not due to balance  OCCUPATION: retire  ACTIVITY LEVEL : walks her dogs and stays busy at home; She lives with daughter  PLOF: Independent  PATIENT GOALS: avoid surgery  PERTINENT HISTORY:  Parathyroidectomy 07/24/22; Laparoscopic appendectomy 12/06/21; Hernia repair 1993; cholecystectomy 1986; anterior and posterior repair with sacrospinous fixation 2020; Osteoporosis; skin cancer  BOWEL MOVEMENT: none  URINATION: Pain with urination: No Fully empty bladder: No will have to urinate again after urinating Stream: average Urgency: Yes , able to get to the bathroom in time Frequency: average Leakage: just after urinating Pads: No  INTERCOURSE:   Ability to have vaginal penetration Yes  Pain with  intercourse: none DrynessYes  Marinoff Scale: 0/3  PREGNANCY: Vaginal deliveries 2 Tearing Yes: after second child she had to have repair in the vaginal area due to the tear   PROLAPSE: Bulge   OBJECTIVE:  Note: Objective measures were completed at Evaluation unless otherwise noted.  DIAGNOSTIC FINDINGS:  none  PATIENT SURVEYS:  UDI-6 ; 25  COGNITION: Overall cognitive status: Within functional limits for tasks assessed     SENSATION: Light touch: Appears intact  POSTURE: No Significant postural limitations   LUMBARAROM/PROM:Lumbar ROM is full   LOWER EXTREMITY ROM:  Passive ROM Right eval Left eval  Hip internal rotation 5 5   (Blank rows = not tested)  LOWER EXTREMITY MMT:  MMT Right eval Left eval  Hip flexion 4/5 4/5  Hip extension 4/5 4/5  Hip abduction 3/5 4/5   (Blank rows = not tested) PALPATION:   Abdominal: Decreased movement of the lower rib cage; contract the abdomen and bulge the lower abdomen                External Perineal Exam: decreased mobility of the perineal body                             Internal Pelvic Floor: tightness in the sides of the introitus along the posterior vaginal wall   Patient confirms identification and approves PT to assess internal pelvic floor and treatment Yes No emotional/communication barriers or cognitive limitation. Patient is motivated to learn. Patient understands and agrees with treatment goals and plan. PT explains patient will be examined in standing, sitting, and lying down to see how their muscles and joints work. When they are ready, they will be asked to remove their underwear so PT can examine their perineum. The patient is also given the option of providing their own chaperone as one is not provided in our facility. The patient also has the right and is explained the right to defer or refuse any part of the evaluation or treatment including the internal exam. With the patient's consent, PT will use  one gloved finger to gently assess the muscles of the pelvic floor, seeing how well it contracts and relaxes and if there is muscle symmetry. After, the patient will get dressed and PT and patient will discuss exam findings and plan of care. PT and patient discuss plan of care, schedule, attendance policy and HEP activities.   PELVIC MMT:   MMT eval 04/27/24  Vaginal 2/5  3/5 with weak  hug of therapist finger and lift  (Blank rows = not tested)        TONE: average  PROLAPSE: Anterior wall weakness   TODAY'S TREATMENT:     04/27/24 Manual: Myofascial release: Fascial release of the urogenital diaphragm to go through the restriction  Internal pelvic floor techniques: No emotional/communication barriers or cognitive limitation. Patient is motivated to learn. Patient understands and agrees with treatment goals and plan. PT explains patient will be examined in standing, sitting, and lying down to see how their muscles and joints work. When they are ready, they will be asked to remove their underwear so PT can examine their perineum. The patient is also given the option of providing their own chaperone as one is not provided in our facility. The patient also has the right and is explained the right to defer or refuse any part of the evaluation or treatment including the internal exam. With the patient's consent, PT will use one gloved finger to gently assess the muscles of the pelvic floor, seeing how well it contracts and relaxes and if there is muscle symmetry. After, the patient will get dressed and PT and patient will discuss exam findings and plan of care. PT and patient discuss plan of care, schedule, attendance policy and HEP activities.  Therapist gloved finger placed in the vaginal canal working on the sides of the introitus, along the perineal body, posterior vaginal canal wall and urethra sphincter to improve tissue mobility.  Neuromuscular re-education: Pelvic floor contraction  training: Therapist gloved finger in the vaginal canal giving tactile cues to the pelvic floor to contract and lift Down training: Diaphragmatic breathing in supine with tactile cues to the lower rib cage to expand then air into the abdomen. Then working in sitting to prepare for urination.  Therapeutic activities: Functional strengthening activities: Educated patient on sitting on the commode with diaphragmatic breathing to relax the pelvic floor and push the urine out. She was able to feel the pressure to push the urine out.                                                                                                                              DATE: 04/18/24  EVAL Examination completed, findings reviewed, pt educated on POC, HEP, and female pelvic floor anatomy, reasoning with pelvic floor assessment internally with pt consent. Pt motivated to participate in PT and agreeable to attempt recommendations. She has weakness in her hips. She will bulge her lower abdomen when contracting. She has decreased movement of the lower rib cage.     PATIENT EDUCATION:  04/27/24 Education details: Access Code: [email or web address]; educated on how to fully empty her bladder Person educated: Patient Education method: Explanation, Demonstration, Tactile cues, Verbal cues, and Handouts Education comprehension: verbalized understanding, returned demonstration, verbal cues required, tactile cues required, and needs further education  HOME EXERCISE PROGRAM: 04/27/24 Access Code: [email or web address] URL: [email or web address] Date: 04/27/2024 Prepared by: Marsha Skeen  Exercises -  Supine Pelvic Floor Contraction  - 2 x daily - 7 x weekly - 1 sets - 10 reps  ASSESSMENT:  CLINICAL IMPRESSION: Patient is a 78 y.o. female who was seen today for physical therapy  treatment for post void dribbling. Patient has learned how to perform diaphragmatic breathing to relax the pelvic floor with urination.  She has tightness in  the posterior vaginal canal and perineal body. She was able to contract with small lift for 1-2 seconds. She is able to feel the pelvic floor relax with diaphragmatic breathing. Patient will benefit from skilled therapy to improve pelvic floor contraction for post void dribbling and pressure management to reduce anterior wall weakness.   OBJECTIVE IMPAIRMENTS: decreased coordination, decreased endurance, decreased strength, and increased fascial restrictions.   ACTIVITY LIMITATIONS: continence and toileting  PARTICIPATION LIMITATIONS: shopping and community activity  PERSONAL FACTORS: Parathyroidectomy 07/24/22; Laparoscopic appendectomy 12/06/21; Hernia repair 1993; cholecystectomy 1986; anterior and posterior repair with sacrospinous fixation 2020; Osteoporosis; skin cancer are also affecting patient's functional outcome.   REHAB POTENTIAL: Excellent  CLINICAL DECISION MAKING: Evolving/moderate complexity  EVALUATION COMPLEXITY: Moderate   GOALS: Goals reviewed with patient? Yes  SHORT TERM GOALS: Target date: 05/16/24  Patient independent with initial HEP for pelvic floor and core strength.  Baseline: Goal status: INITIAL  2.  Patient able to open up her lower rib cage for diaphragmatic breathing to work on relaxation of the pelvic floor.  Baseline:  Goal status: INITIAL  3.  Patient understands how to perform double void to reduce the urinary leakage after urinating.  Baseline:  Goal status: INITIAL   LONG TERM GOALS: Target date: 07/11/24  Patient independent with advanced HEP  Baseline:  Goal status: INITIAL  2.  Patient understands prolapse management to prevent further progression of the anterior wall weakness.  Baseline:  Goal status: INITIAL  3.  Pelvic floor strength is >/= 3/5 holding for 10 seconds to reduce post void dribbling and urine to go on her underwear causing a smell.  Baseline:  Goal status: INITIAL  4.  Patient reports she feels the bulge in the  vaginal area 50% less due to improved strength and reduce chance of having surgery.  Baseline:  Goal status: INITIAL   PLAN:  PT FREQUENCY: 1-2x/week  PT DURATION: 12 weeks  PLANNED INTERVENTIONS: 97110-Therapeutic exercises, 97530- Therapeutic activity, 97112- Neuromuscular re-education, 97535- Self Care, 98119- Manual therapy, 20560 (1-2 muscles), 20561 (3+ muscles)- Dry Needling, Patient/Family education, Joint mobilization, Spinal mobilization, Cryotherapy, Moist heat, and Biofeedback  PLAN FOR NEXT SESSION: see what MD says,  manual work to pelvic floor to work on tissue mobility; prolapse management; hip internal rotation  Marsha Skeen, PT 04/27/24 4:16 PM

## 2024-04-27 NOTE — Patient Instructions (Addendum)
How To Poop Better:  What are Good Poops? There is no one exact normal, but they should be REGULAR.  This varies from person to person and ranges from up to 3x/day or as little as 3-4/week.  This should stay consistent for you.  They should be formed and ideally one solid mass that doesn't fall apart or dissolve in the water and is brown in color.  Lifestyle Tips:  Fiber: Eat 25-31 grams per day Do not hold it.  If you need to go, GO! Try to go every day around the same time Walk and move more Probiotics for more healthy gut bacteria Water and fluids: half of your healthy body weight in ounces  Toileting Tips:  Posture: knees above hips, back flat, look straight ahead, RELAX Relax all the muscles from your face down to your toes Breathe: slow deep breaths into your belly and pelvic floor is RELAXED Blow: Tighten belly and blow like blowing up a balloon, make "SH" sound, make a vowel sound with a deep voice Do NOT sit more than 10 minutes After you are finished, tighten the muscles to reset pelvic floor back to normal     [facility] [address] [place], [place] [address] Phone # [phone] Fax [phone]

## 2024-04-28 DIAGNOSIS — Z6821 Body mass index (BMI) 21.0-21.9, adult: Secondary | ICD-10-CM | POA: Diagnosis not present

## 2024-05-02 DIAGNOSIS — H353131 Nonexudative age-related macular degeneration, bilateral, early dry stage: Secondary | ICD-10-CM | POA: Diagnosis not present

## 2024-05-11 ENCOUNTER — Ambulatory Visit: Admitting: Physical Therapy

## 2024-05-23 ENCOUNTER — Encounter: Payer: Self-pay | Admitting: Physical Therapy

## 2024-05-23 ENCOUNTER — Ambulatory Visit: Attending: Family | Admitting: Physical Therapy

## 2024-05-23 ENCOUNTER — Telehealth: Payer: Self-pay | Admitting: Gastroenterology

## 2024-05-23 DIAGNOSIS — M6281 Muscle weakness (generalized): Secondary | ICD-10-CM | POA: Insufficient documentation

## 2024-05-23 DIAGNOSIS — R278 Other lack of coordination: Secondary | ICD-10-CM | POA: Diagnosis not present

## 2024-05-23 NOTE — Therapy (Signed)
 OUTPATIENT PHYSICAL THERAPY FEMALE PELVIC TREATMENT   Patient Name: Alexandria Hudson MRN: [id number] DOB:[birth year], 78 y.o., female Today's Date: 05/23/2024  END OF SESSION:  PT End of Session - 05/23/24 1100     Visit Number 3    Date for PT Re-Evaluation 07/11/24    Authorization Type [employer] medicare    Authorization Time Period 04/18/2024-06/13/2024    Authorization - Visit Number 3    Authorization - Number of Visits 16    Progress Note Due on Visit 10    PT Start Time 1100    PT Stop Time 1140    PT Time Calculation (min) 40 min    Activity Tolerance Patient tolerated treatment well    Behavior During Therapy WFL for tasks assessed/performed          Past Medical History:  Diagnosis Date   Cancer (HCC)    skin cancer   Carpal tunnel syndrome    BOTH WRISTS    Cataracts, both eyes    Family history of adverse reaction to anesthesia    sister problems with N/V   GERD (gastroesophageal reflux disease)    Headache    MIGRAINES   Hyperparathyroidism (HCC)    Osteoporosis    Pneumonia    as a child   PONV (postoperative nausea and vomiting)    NO NAUSEA SINCE TUBES TIED   Past Surgical History:  Procedure Laterality Date   ABDOMINAL HYSTERECTOMY  38 YRS AGO   PARTIAL   ANTERIOR AND POSTERIOR REPAIR WITH SACROSPINOUS FIXATION N/A 01/03/2019   Procedure: ANTERIOR AND POSTERIOR REPAIR WITH SACROSPINOUS FIXATION;  Surgeon: Dannielle Bouchard, DO;  Location: [facility];  Service: Gynecology;  Laterality: N/A;   APPENDECTOMY     CATARACT EXTRACTION, BILATERAL     CHOLECYSTECTOMY  1986   COLONOSCOPY     HERNIA REPAIR  1993   INGUINAL   LAPAROSCOPIC APPENDECTOMY N/A 12/06/2021   Procedure: APPENDECTOMY LAPAROSCOPIC;  Surgeon: Gladis Cough, MD;  Location: [facility];  Service: General;  Laterality: N/A;   PARATHYROIDECTOMY Right 07/24/2022   Procedure: RIGHT PARATHYROIDECTOMY;  Surgeon: Eletha Boas, MD;  Location: [facility];  Service: General;  Laterality: Right;    SKIN CANCER AREAS REMOVED     TONSILLECTOMY     TUBAL LIGATION     Patient Active Problem List   Diagnosis Date Noted   Hyperparathyroidism, primary (HCC) 07/18/2022   Bilateral impacted cerumen 12/27/2019   Prolapse of female pelvic organs 01/03/2019   Pelvic organ prolapse quantification stage 3 rectocele 01/03/2019   Chronic sinusitis 02/10/2017   Gastroesophageal reflux disease 02/10/2017   Hoarseness of voice 02/10/2017   Xerophthalmia 02/10/2017   Xerostomia 02/10/2017    PCP: Chrystal Lamarr RAMAN, MD  REFERRING PROVIDER: Ann Darice HERO, FNP   REFERRING DIAG: [phone] (ICD-10-CM) - Post-void dribbling   THERAPY DIAG:  Muscle weakness (generalized)  Other lack of coordination  Rationale for Evaluation and Treatment: Rehabilitation  ONSET DATE: 1/25  SUBJECTIVE:  SUBJECTIVE STATEMENT: I have been using the estrogen cream.  Fluid intake: water, soda, coffee  PAIN:  Are you having pain? No  PRECAUTIONS: Other: skin cancer  RED FLAGS: None   WEIGHT BEARING RESTRICTIONS: No  FALLS:  Has patient fallen in last 6 months? Yes. Number of falls fell over her daughters dog but not due to balance  OCCUPATION: retire  ACTIVITY LEVEL : walks her dogs and stays busy at home; She lives with daughter  PLOF: Independent  PATIENT GOALS: avoid surgery  PERTINENT HISTORY:  Parathyroidectomy 07/24/22; Laparoscopic appendectomy 12/06/21; Hernia repair 1993; cholecystectomy 1986; anterior and posterior repair with sacrospinous fixation 2020; Osteoporosis; skin cancer  BOWEL MOVEMENT: none  URINATION: Pain with urination: No Fully empty bladder: No will have to urinate again after urinating Stream: average Urgency: Yes , able to get to the bathroom in time Frequency: average Leakage:  just after urinating Pads: No  INTERCOURSE:   Ability to have vaginal penetration Yes  Pain with intercourse: none DrynessYes  Marinoff Scale: 0/3  PREGNANCY: Vaginal deliveries 2 Tearing Yes: after second child she had to have repair in the vaginal area due to the tear   PROLAPSE: Bulge   OBJECTIVE:  Note: Objective measures were completed at Evaluation unless otherwise noted.  DIAGNOSTIC FINDINGS:  none  PATIENT SURVEYS:  UDI-6 ; 25  COGNITION: Overall cognitive status: Within functional limits for tasks assessed     SENSATION: Light touch: Appears intact  POSTURE: No Significant postural limitations   LUMBARAROM/PROM:Lumbar ROM is full   LOWER EXTREMITY ROM:  Passive ROM Right eval Left eval  Hip internal rotation 5 5   (Blank rows = not tested)  LOWER EXTREMITY MMT:  MMT Right eval Left eval  Hip flexion 4/5 4/5  Hip extension 4/5 4/5  Hip abduction 3/5 4/5   (Blank rows = not tested) PALPATION:   Abdominal: Decreased movement of the lower rib cage; contract the abdomen and bulge the lower abdomen                External Perineal Exam: decreased mobility of the perineal body                             Internal Pelvic Floor: tightness in the sides of the introitus along the posterior vaginal wall   Patient confirms identification and approves PT to assess internal pelvic floor and treatment Yes No emotional/communication barriers or cognitive limitation. Patient is motivated to learn. Patient understands and agrees with treatment goals and plan. PT explains patient will be examined in standing, sitting, and lying down to see how their muscles and joints work. When they are ready, they will be asked to remove their underwear so PT can examine their perineum. The patient is also given the option of providing their own chaperone as one is not provided in our facility. The patient also has the right and is explained the right to defer or refuse any part  of the evaluation or treatment including the internal exam. With the patient's consent, PT will use one gloved finger to gently assess the muscles of the pelvic floor, seeing how well it contracts and relaxes and if there is muscle symmetry. After, the patient will get dressed and PT and patient will discuss exam findings and plan of care. PT and patient discuss plan of care, schedule, attendance policy and HEP activities.   PELVIC MMT:   MMT eval 04/27/24  Vaginal  2/5  3/5 with weak hug of therapist finger and lift  (Blank rows = not tested)        TONE: average  PROLAPSE: Anterior wall weakness   TODAY'S TREATMENT:    05/23/24 Neuromuscular re-education: Core retraining: Supine with legs on physioball with pelvic floor contraction and not bulging the lower abdomen Supine with legs on physioball with pelvic floor contraction with ball squeeze Supine with legs on physioball with pelvic floor contraction with hip ER with red band  Supine with legs on physioball  diaphragmatic breathing and tactile cues to not bulge the lower abdomen Squat to standing with pelvic floor and lower abdominal contraction to reduce vaginal bulge Therapeutic activities: Functional strengthening activities: Educated patient on double voiding, using diaphragmatic breathing, moving hips to fully empty her bladder.  Educated patient on keeping distance between the rib cage and pubic bone with lifting and gardening to reduce the pressure of the prolapse Educated patient on how lay with her feet elevated to reduce the prolapse when she is feeling a bulge     04/27/24 Manual: Myofascial release: Fascial release of the urogenital diaphragm to go through the restriction  Internal pelvic floor techniques: No emotional/communication barriers or cognitive limitation. Patient is motivated to learn. Patient understands and agrees with treatment goals and plan. PT explains patient will be examined in standing, sitting, and  lying down to see how their muscles and joints work. When they are ready, they will be asked to remove their underwear so PT can examine their perineum. The patient is also given the option of providing their own chaperone as one is not provided in our facility. The patient also has the right and is explained the right to defer or refuse any part of the evaluation or treatment including the internal exam. With the patient's consent, PT will use one gloved finger to gently assess the muscles of the pelvic floor, seeing how well it contracts and relaxes and if there is muscle symmetry. After, the patient will get dressed and PT and patient will discuss exam findings and plan of care. PT and patient discuss plan of care, schedule, attendance policy and HEP activities.  Therapist gloved finger placed in the vaginal canal working on the sides of the introitus, along the perineal body, posterior vaginal canal wall and urethra sphincter to improve tissue mobility.  Neuromuscular re-education: Pelvic floor contraction training: Therapist gloved finger in the vaginal canal giving tactile cues to the pelvic floor to contract and lift Down training: Diaphragmatic breathing in supine with tactile cues to the lower rib cage to expand then air into the abdomen. Then working in sitting to prepare for urination.  Therapeutic activities: Functional strengthening activities: Educated patient on sitting on the commode with diaphragmatic breathing to relax the pelvic floor and push the urine out. She was able to feel the pressure to push the urine out.  DATE: 04/18/24  EVAL Examination completed, findings reviewed, pt educated on POC, HEP, and female pelvic floor anatomy, reasoning with pelvic floor assessment internally with pt consent. Pt motivated to participate in PT and agreeable to attempt  recommendations. She has weakness in her hips. She will bulge her lower abdomen when contracting. She has decreased movement of the lower rib cage.     PATIENT EDUCATION:  05/23/24 Education details: Access Code: [email or web address]; educated on how to fully empty her bladder Person educated: Patient Education method: Explanation, Demonstration, Tactile cues, Verbal cues, and Handouts Education comprehension: verbalized understanding, returned demonstration, verbal cues required, tactile cues required, and needs further education  HOME EXERCISE PROGRAM: 05/23/24 Access Code: [email or web address] URL: [email or web address] Date: 05/23/2024 Prepared by: Channing Pereyra  Exercises - Supine Pelvic Floor Contraction  - 2 x daily - 7 x weekly - 1 sets - 10 reps - Diaphragmatic Breathing with Hips Elevated (for Pelvic Organ Prolapse)  - 1 x daily - 7 x weekly - 1 sets - 10 reps - Pelvic Floor Muscle Contraction and Pelvic Tilt to Bridge With Hips Elevated (for Pelvic Organ Prolapse)  - 1 x daily - 3 x weekly - 1 sets - 10 reps - 10 sec hold - Pelvic Floor Muscle Contraction and Adductor Squeeze With Hips Elevated (for Pelvic Organ Prolapse)  - 1 x daily - 3 x weekly - 1 sets - 10 reps - Pelvic Floor Muscle Contraction With Resisted Abduction With Hips Elevated (for Pelvic Organ Prolapse)  - 1 x daily - 3 x weekly - 1 sets - 10 reps  ASSESSMENT:  CLINICAL IMPRESSION: Patient is a 78 y.o. female who was seen today for physical therapy  treatment for post void dribbling. Patient has only had leakage 2 times since last visit and emptying her bladder. She needed verbal cues to not bulge her lower abdomen with her exercises. She is able to perform diaphragmatic breathing with opening her abdomen well.  Patient was educated on double voiding to fully empty her bladder. Patient will benefit from skilled therapy to improve pelvic floor contraction for post void dribbling and pressure management to reduce anterior wall  weakness.   OBJECTIVE IMPAIRMENTS: decreased coordination, decreased endurance, decreased strength, and increased fascial restrictions.   ACTIVITY LIMITATIONS: continence and toileting  PARTICIPATION LIMITATIONS: shopping and community activity  PERSONAL FACTORS: Parathyroidectomy 07/24/22; Laparoscopic appendectomy 12/06/21; Hernia repair 1993; cholecystectomy 1986; anterior and posterior repair with sacrospinous fixation 2020; Osteoporosis; skin cancer are also affecting patient's functional outcome.   REHAB POTENTIAL: Excellent  CLINICAL DECISION MAKING: Evolving/moderate complexity  EVALUATION COMPLEXITY: Moderate   GOALS: Goals reviewed with patient? Yes  SHORT TERM GOALS: Target date: 05/16/24  Patient independent with initial HEP for pelvic floor and core strength.  Baseline: Goal status: INITIAL  2.  Patient able to open up her lower rib cage for diaphragmatic breathing to work on relaxation of the pelvic floor.  Baseline:  Goal status: Met 05/23/24  3.  Patient understands how to perform double void to reduce the urinary leakage after urinating.  Baseline:  Goal status: Met 05/23/24   LONG TERM GOALS: Target date: 07/11/24  Patient independent with advanced HEP  Baseline:  Goal status: INITIAL  2.  Patient understands prolapse management to prevent further progression of the anterior wall weakness.  Baseline:  Goal status: INITIAL  3.  Pelvic floor strength is >/= 3/5 holding for 10 seconds to reduce post void dribbling and urine to go on her underwear causing a smell.  Baseline:  Goal status: INITIAL  4.  Patient reports she feels the bulge in the vaginal area 50% less due to improved strength and reduce chance of having surgery.  Baseline:  Goal status: INITIAL   PLAN:  PT FREQUENCY: 1-2x/week  PT DURATION: 12 weeks  PLANNED INTERVENTIONS: 97110-Therapeutic exercises, 97530- Therapeutic activity, 97112- Neuromuscular re-education, 97535- Self Care,  97140- Manual therapy, 20560 (1-2 muscles), 20561 (3+ muscles)- Dry Needling, Patient/Family education, Joint mobilization, Spinal mobilization, Cryotherapy, Moist heat, and Biofeedback  PLAN FOR NEXT SESSION:  manual work to pelvic floor to work on tissue mobility; prolapse management; hip internal rotation; sidely core exercise and quadruped  Channing Pereyra, PT 05/23/24 11:53 AM

## 2024-05-23 NOTE — Telephone Encounter (Signed)
 Good afternoon Dr. Leigh,   Patient has a recall for a colonoscopy for September. Patient is now the age of 76. Would you like for patient to be scheduled directly for colonoscopy? Please review and advise on scheduling.   Thank you.

## 2024-05-23 NOTE — Patient Instructions (Signed)
   The first picture shows that there is no effect on the pelvic floor with gravity eliminated. The next three show that with a wedge pillow or a few pillows from home under your pelvis the pelvic floor is inverted and may relax and allows gravity to help return prolapsed areas more inward to help relieve symptoms. Do this 15-20 mins every evening when symptoms tend to be worse. Stop if you have pain or negative symptoms.    Double Voiding can be a very useful technique to help overcome incomplete emptying of your bladder.  Incomplete emptying of urine can result in leakage after using the bathroom and increase the risk of urinary tract infection.   Initial Void: When you first sit down to urinate, ensure optimal positioning for bladder emptying by following these guidelines for toileting posture: Sit on the toilet seat - don't hover over the seat Support your trunk by placing your hands on your knees or thighs Spread your knees and hips wide Position your feet flat on the floor or elevate feet on phone books, foot stool (Squatty Potty), or wrapped toilet paper rolls (if having knees above hips helps you empty) Lean forward from your hips Maintain the normal inward curve in your lower back   Repeated Void: After your initial void is complete, follow these movement patterns and attempt going to the bathroom again. Stand up Rotate your hips as if doing hula hoop in one direction Rotate using the same action in the other direction Rock your hips and pelvis back and forwards (pelvic tilts) Rock your hips and pelvis side to side (tail wag) Sit back down and repeat your voiding technique This technique can be repeated as many times as you choose to help you empty your bladder more effectively.

## 2024-05-23 NOTE — Telephone Encounter (Signed)
 Yes she has a history of numerous polyps in the past, can proceed with surveillance colonoscopy in the [facility] as scheduled.  Thank you

## 2024-05-26 NOTE — Telephone Encounter (Signed)
 Left voicemail for patient to call and schedule

## 2024-05-30 ENCOUNTER — Ambulatory Visit: Admitting: Physical Therapy

## 2024-05-30 ENCOUNTER — Encounter: Payer: Self-pay | Admitting: Physical Therapy

## 2024-05-30 DIAGNOSIS — R278 Other lack of coordination: Secondary | ICD-10-CM | POA: Diagnosis not present

## 2024-05-30 DIAGNOSIS — M6281 Muscle weakness (generalized): Secondary | ICD-10-CM | POA: Diagnosis not present

## 2024-05-30 NOTE — Therapy (Signed)
 OUTPATIENT PHYSICAL THERAPY FEMALE PELVIC TREATMENT   Patient Name: RAFEEF Hudson MRN: [id number] DOB:09-Aug-1946, 78 y.o., female Today's Date: 05/30/2024  END OF SESSION:  PT End of Session - 05/30/24 1151     Visit Number 4    Date for PT Re-Evaluation 07/11/24    Authorization Type [employer] medicare    Authorization Time Period 04/18/2024-06/13/2024    Authorization - Visit Number 4    Authorization - Number of Visits 16    Progress Note Due on Visit 10    PT Start Time 1145    PT Stop Time 1225    PT Time Calculation (min) 40 min    Activity Tolerance Patient tolerated treatment well    Behavior During Therapy WFL for tasks assessed/performed          Past Medical History:  Diagnosis Date   Cancer (HCC)    skin cancer   Carpal tunnel syndrome    BOTH WRISTS    Cataracts, both eyes    Family history of adverse reaction to anesthesia    sister problems with N/V   GERD (gastroesophageal reflux disease)    Headache    MIGRAINES   Hyperparathyroidism (HCC)    Osteoporosis    Pneumonia    as a child   PONV (postoperative nausea and vomiting)    NO NAUSEA SINCE TUBES TIED   Past Surgical History:  Procedure Laterality Date   ABDOMINAL HYSTERECTOMY  38 YRS AGO   PARTIAL   ANTERIOR AND POSTERIOR REPAIR WITH SACROSPINOUS FIXATION N/A 01/03/2019   Procedure: ANTERIOR AND POSTERIOR REPAIR WITH SACROSPINOUS FIXATION;  Surgeon: Dannielle Bouchard, DO;  Location: [facility];  Service: Gynecology;  Laterality: N/A;   APPENDECTOMY     CATARACT EXTRACTION, BILATERAL     CHOLECYSTECTOMY  1986   COLONOSCOPY     HERNIA REPAIR  1993   INGUINAL   LAPAROSCOPIC APPENDECTOMY N/A 12/06/2021   Procedure: APPENDECTOMY LAPAROSCOPIC;  Surgeon: Gladis Cough, MD;  Location: [facility];  Service: General;  Laterality: N/A;   PARATHYROIDECTOMY Right 07/24/2022   Procedure: RIGHT PARATHYROIDECTOMY;  Surgeon: Eletha Boas, MD;  Location: [facility];  Service: General;  Laterality: Right;    SKIN CANCER AREAS REMOVED     TONSILLECTOMY     TUBAL LIGATION     Patient Active Problem List   Diagnosis Date Noted   Hyperparathyroidism, primary (HCC) 07/18/2022   Bilateral impacted cerumen 12/27/2019   Prolapse of female pelvic organs 01/03/2019   Pelvic organ prolapse quantification stage 3 rectocele 01/03/2019   Chronic sinusitis 02/10/2017   Gastroesophageal reflux disease 02/10/2017   Hoarseness of voice 02/10/2017   Xerophthalmia 02/10/2017   Xerostomia 02/10/2017    PCP: Chrystal Lamarr RAMAN, MD  REFERRING PROVIDER: Ann Darice HERO, FNP   REFERRING DIAG: [phone] (ICD-10-CM) - Post-void dribbling   THERAPY DIAG:  Muscle weakness (generalized)  Other lack of coordination  Rationale for Evaluation and Treatment: Rehabilitation  ONSET DATE: 1/25  SUBJECTIVE:  SUBJECTIVE STATEMENT: My urinary leakage is a lot better. I am remembering to fully empty her bladder. Urinary leakage is 80% better.  I have been using the estrogen cream.  Fluid intake: water, soda, coffee  PAIN:  Are you having pain? No  PRECAUTIONS: Other: skin cancer  RED FLAGS: None   WEIGHT BEARING RESTRICTIONS: No  FALLS:  Has patient fallen in last 6 months? Yes. Number of falls fell over her daughters dog but not due to balance  OCCUPATION: retire  ACTIVITY LEVEL : walks her dogs and stays busy at home; She lives with daughter  PLOF: Independent  PATIENT GOALS: avoid surgery  PERTINENT HISTORY:  Parathyroidectomy 07/24/22; Laparoscopic appendectomy 12/06/21; Hernia repair 1993; cholecystectomy 1986; anterior and posterior repair with sacrospinous fixation 2020; Osteoporosis; skin cancer  BOWEL MOVEMENT: none  URINATION: Pain with urination: No Fully empty bladder: No will have to urinate again  after urinating Stream: average Urgency: Yes , able to get to the bathroom in time Frequency: average Leakage: just after urinating Pads: No  INTERCOURSE:   Ability to have vaginal penetration Yes  Pain with intercourse: none DrynessYes  Marinoff Scale: 0/3  PREGNANCY: Vaginal deliveries 2 Tearing Yes: after second child she had to have repair in the vaginal area due to the tear   PROLAPSE: Bulge   OBJECTIVE:  Note: Objective measures were completed at Evaluation unless otherwise noted.  DIAGNOSTIC FINDINGS:  none  PATIENT SURVEYS:  UDI-6 ; 25  COGNITION: Overall cognitive status: Within functional limits for tasks assessed     SENSATION: Light touch: Appears intact  POSTURE: No Significant postural limitations   LUMBARAROM/PROM:Lumbar ROM is full   LOWER EXTREMITY ROM:  Passive ROM Right eval Left eval  Hip internal rotation 5 5   (Blank rows = not tested)  LOWER EXTREMITY MMT:  MMT Right eval Left eval  Hip flexion 4/5 4/5  Hip extension 4/5 4/5  Hip abduction 3/5 4/5   (Blank rows = not tested) PALPATION:   Abdominal: Decreased movement of the lower rib cage; contract the abdomen and bulge the lower abdomen                External Perineal Exam: decreased mobility of the perineal body                             Internal Pelvic Floor: tightness in the sides of the introitus along the posterior vaginal wall   Patient confirms identification and approves PT to assess internal pelvic floor and treatment Yes No emotional/communication barriers or cognitive limitation. Patient is motivated to learn. Patient understands and agrees with treatment goals and plan. PT explains patient will be examined in standing, sitting, and lying down to see how their muscles and joints work. When they are ready, they will be asked to remove their underwear so PT can examine their perineum. The patient is also given the option of providing their own chaperone as one is  not provided in our facility. The patient also has the right and is explained the right to defer or refuse any part of the evaluation or treatment including the internal exam. With the patient's consent, PT will use one gloved finger to gently assess the muscles of the pelvic floor, seeing how well it contracts and relaxes and if there is muscle symmetry. After, the patient will get dressed and PT and patient will discuss exam findings and plan of care. PT and patient  discuss plan of care, schedule, attendance policy and HEP activities.   PELVIC MMT:   MMT eval 04/27/24  Vaginal 2/5  3/5 with weak hug of therapist finger and lift  (Blank rows = not tested)        TONE: average  PROLAPSE: Anterior wall weakness   TODAY'S TREATMENT:   05/30/24 Neuromuscular re-education: Core retraining: Supine diaphragmatic breathing 10 x Supine pelvic floor contraction holding for 10 sec 10 times Supine clamshell with band that has 3 dots Bridge 15 x with pelvic floor contraction  Laying on side hip abduction with breathing out on lifting leg and contract the pelvic and abdominals Leaning on the counter and lift alternate arm and leg with core and pelvic floor engaged.      05/23/24 Neuromuscular re-education: Core retraining: Supine with legs on physioball with pelvic floor contraction and not bulging the lower abdomen Supine with legs on physioball with pelvic floor contraction with ball squeeze Supine with legs on physioball with pelvic floor contraction with hip ER with red band  Supine with legs on physioball  diaphragmatic breathing and tactile cues to not bulge the lower abdomen Squat to standing with pelvic floor and lower abdominal contraction to reduce vaginal bulge Therapeutic activities: Functional strengthening activities: Educated patient on double voiding, using diaphragmatic breathing, moving hips to fully empty her bladder.  Educated patient on keeping distance between the rib cage  and pubic bone with lifting and gardening to reduce the pressure of the prolapse Educated patient on how lay with her feet elevated to reduce the prolapse when she is feeling a bulge     04/27/24 Manual: Myofascial release: Fascial release of the urogenital diaphragm to go through the restriction  Internal pelvic floor techniques: No emotional/communication barriers or cognitive limitation. Patient is motivated to learn. Patient understands and agrees with treatment goals and plan. PT explains patient will be examined in standing, sitting, and lying down to see how their muscles and joints work. When they are ready, they will be asked to remove their underwear so PT can examine their perineum. The patient is also given the option of providing their own chaperone as one is not provided in our facility. The patient also has the right and is explained the right to defer or refuse any part of the evaluation or treatment including the internal exam. With the patient's consent, PT will use one gloved finger to gently assess the muscles of the pelvic floor, seeing how well it contracts and relaxes and if there is muscle symmetry. After, the patient will get dressed and PT and patient will discuss exam findings and plan of care. PT and patient discuss plan of care, schedule, attendance policy and HEP activities.  Therapist gloved finger placed in the vaginal canal working on the sides of the introitus, along the perineal body, posterior vaginal canal wall and urethra sphincter to improve tissue mobility.  Neuromuscular re-education: Pelvic floor contraction training: Therapist gloved finger in the vaginal canal giving tactile cues to the pelvic floor to contract and lift Down training: Diaphragmatic breathing in supine with tactile cues to the lower rib cage to expand then air into the abdomen. Then working in sitting to prepare for urination.  Therapeutic activities: Functional strengthening  activities: Educated patient on sitting on the commode with diaphragmatic breathing to relax the pelvic floor and push the urine out. She was able to feel the pressure to push the urine out.       PATIENT EDUCATION:  05/30/24  Education details: Access Code: [email or web address]; educated on how to fully empty her bladder Person educated: Patient Education method: Explanation, Demonstration, Tactile cues, Verbal cues, and Handouts Education comprehension: verbalized understanding, returned demonstration, verbal cues required, tactile cues required, and needs further education  HOME EXERCISE PROGRAM: 05/30/24 Access Code: [email or web address] URL: [email or web address] Date: 05/30/2024 Prepared by: Channing Pereyra  Exercises - Supine Pelvic Floor Contraction  - 2 x daily - 7 x weekly - 1 sets - 10 reps - Diaphragmatic Breathing with Hips Elevated (for Pelvic Organ Prolapse)  - 1 x daily - 7 x weekly - 1 sets - 10 reps - Pelvic Floor Muscle Contraction and Pelvic Tilt to Bridge With Hips Elevated (for Pelvic Organ Prolapse)  - 1 x daily - 3 x weekly - 1 sets - 10 reps - 10 sec hold - Pelvic Floor Muscle Contraction and Adductor Squeeze With Hips Elevated (for Pelvic Organ Prolapse)  - 1 x daily - 3 x weekly - 1 sets - 10 reps - Pelvic Floor Muscle Contraction With Resisted Abduction With Hips Elevated (for Pelvic Organ Prolapse)  - 1 x daily - 3 x weekly - 1 sets - 10 reps - Sidelying Pelvic Floor Contraction with Hip Abduction  - 1 x daily - 3 x weekly - 1 sets - 10 reps - Supine Bridge with Pelvic Floor Contraction  - 1 x daily - 3 x weekly - 1 sets - 15 reps - Bird Dog on Counter  - 1 x daily - 3 x weekly - 1 sets - 10 reps  ASSESSMENT:  CLINICAL IMPRESSION: Patient is a 78 y.o. female who was seen today for physical therapy  treatment for post void dribbling. Urinary leakage is 80% better. She is able to engage her abdomen correctly. She is able to perform diaphragmatic breathing. Patient will  benefit from skilled therapy to improve pelvic floor contraction for post void dribbling and pressure management to reduce anterior wall weakness.   OBJECTIVE IMPAIRMENTS: decreased coordination, decreased endurance, decreased strength, and increased fascial restrictions.   ACTIVITY LIMITATIONS: continence and toileting  PARTICIPATION LIMITATIONS: shopping and community activity  PERSONAL FACTORS: Parathyroidectomy 07/24/22; Laparoscopic appendectomy 12/06/21; Hernia repair 1993; cholecystectomy 1986; anterior and posterior repair with sacrospinous fixation 2020; Osteoporosis; skin cancer are also affecting patient's functional outcome.   REHAB POTENTIAL: Excellent  CLINICAL DECISION MAKING: Evolving/moderate complexity  EVALUATION COMPLEXITY: Moderate   GOALS: Goals reviewed with patient? Yes  SHORT TERM GOALS: Target date: 05/16/24  Patient independent with initial HEP for pelvic floor and core strength.  Baseline: Goal status: Met 05/30/24  2.  Patient able to open up her lower rib cage for diaphragmatic breathing to work on relaxation of the pelvic floor.  Baseline:  Goal status: Met 05/23/24  3.  Patient understands how to perform double void to reduce the urinary leakage after urinating.  Baseline:  Goal status: Met 05/23/24   LONG TERM GOALS: Target date: 07/11/24  Patient independent with advanced HEP  Baseline:  Goal status: INITIAL  2.  Patient understands prolapse management to prevent further progression of the anterior wall weakness.  Baseline:  Goal status: INITIAL  3.  Pelvic floor strength is >/= 3/5 holding for 10 seconds to reduce post void dribbling and urine to go on her underwear causing a smell.  Baseline:  Goal status: INITIAL  4.  Patient reports she feels the bulge in the vaginal area 50% less due to improved strength and reduce chance of having surgery.  Baseline:  Goal status:  INITIAL   PLAN:  PT FREQUENCY: 1-2x/week  PT DURATION: 12  weeks  PLANNED INTERVENTIONS: 97110-Therapeutic exercises, 97530- Therapeutic activity, 97112- Neuromuscular re-education, 97535- Self Care, [id number]- Manual therapy, 20560 (1-2 muscles), 20561 (3+ muscles)- Dry Needling, Patient/Family education, Joint mobilization, Spinal mobilization, Cryotherapy, Moist heat, and Biofeedback  PLAN FOR NEXT SESSION:   prolapse management; hip internal rotation; sidely core exercise and quadruped  Channing Pereyra, PT 05/30/24 12:32 PM

## 2024-05-31 ENCOUNTER — Encounter: Payer: Self-pay | Admitting: Gastroenterology

## 2024-06-08 ENCOUNTER — Ambulatory Visit

## 2024-06-09 ENCOUNTER — Ambulatory Visit

## 2024-06-09 DIAGNOSIS — R278 Other lack of coordination: Secondary | ICD-10-CM | POA: Diagnosis not present

## 2024-06-09 DIAGNOSIS — M6281 Muscle weakness (generalized): Secondary | ICD-10-CM | POA: Diagnosis not present

## 2024-06-09 NOTE — Therapy (Signed)
 OUTPATIENT PHYSICAL THERAPY FEMALE PELVIC TREATMENT   Patient Name: Alexandria Hudson MRN: [id number] DOB:1946-01-23, 78 y.o., female Today's Date: 06/09/2024  END OF SESSION:  PT End of Session - 06/09/24 1440     Visit Number 5    Date for PT Re-Evaluation 09/01/24    Authorization Type [employer] medicare    Authorization Time Period 04/18/2024-06/13/2024    Authorization - Visit Number 5    Authorization - Number of Visits 16    Progress Note Due on Visit 10    PT Start Time 1443    PT Stop Time 1526    PT Time Calculation (min) 43 min    Activity Tolerance Patient tolerated treatment well    Behavior During Therapy WFL for tasks assessed/performed           Past Medical History:  Diagnosis Date   Cancer (HCC)    skin cancer   Carpal tunnel syndrome    BOTH WRISTS    Cataracts, both eyes    Family history of adverse reaction to anesthesia    sister problems with N/V   GERD (gastroesophageal reflux disease)    Headache    MIGRAINES   Hyperparathyroidism (HCC)    Osteoporosis    Pneumonia    as a child   PONV (postoperative nausea and vomiting)    NO NAUSEA SINCE TUBES TIED   Past Surgical History:  Procedure Laterality Date   ABDOMINAL HYSTERECTOMY  38 YRS AGO   PARTIAL   ANTERIOR AND POSTERIOR REPAIR WITH SACROSPINOUS FIXATION N/A 01/03/2019   Procedure: ANTERIOR AND POSTERIOR REPAIR WITH SACROSPINOUS FIXATION;  Surgeon: Dannielle Bouchard, DO;  Location: [facility];  Service: Gynecology;  Laterality: N/A;   APPENDECTOMY     CATARACT EXTRACTION, BILATERAL     CHOLECYSTECTOMY  1986   COLONOSCOPY     HERNIA REPAIR  1993   INGUINAL   LAPAROSCOPIC APPENDECTOMY N/A 12/06/2021   Procedure: APPENDECTOMY LAPAROSCOPIC;  Surgeon: Gladis Cough, MD;  Location: [facility];  Service: General;  Laterality: N/A;   PARATHYROIDECTOMY Right 07/24/2022   Procedure: RIGHT PARATHYROIDECTOMY;  Surgeon: Eletha Boas, MD;  Location: [facility];  Service: General;  Laterality:  Right;   SKIN CANCER AREAS REMOVED     TONSILLECTOMY     TUBAL LIGATION     Patient Active Problem List   Diagnosis Date Noted   Hyperparathyroidism, primary (HCC) 07/18/2022   Bilateral impacted cerumen 12/27/2019   Prolapse of female pelvic organs 01/03/2019   Pelvic organ prolapse quantification stage 3 rectocele 01/03/2019   Chronic sinusitis 02/10/2017   Gastroesophageal reflux disease 02/10/2017   Hoarseness of voice 02/10/2017   Xerophthalmia 02/10/2017   Xerostomia 02/10/2017    PCP: Chrystal Lamarr RAMAN, MD  REFERRING PROVIDER: Ann Darice HERO, FNP   REFERRING DIAG: [phone] (ICD-10-CM) - Post-void dribbling   THERAPY DIAG:  Muscle weakness (generalized)  Other lack of coordination  Rationale for Evaluation and Treatment: Rehabilitation  ONSET DATE: 1/25  SUBJECTIVE:  SUBJECTIVE STATEMENT: Pt states that as long as she remembers to double void she does not have any urinary leaking usually. She still reports about 80% improvement overall.   Fluid intake: water, soda, coffee  PAIN: 06/09/24 Are you having pain? Yes Pain in bil LB Pain is aching 3/10 Worse when she is on her feet more Better with sitting and rest - sometimes  PRECAUTIONS: Other: skin cancer  RED FLAGS: None   WEIGHT BEARING RESTRICTIONS: No  FALLS:  Has patient fallen in last 6 months? Yes. Number of falls fell over her daughters dog but not due to balance  OCCUPATION: retire  ACTIVITY LEVEL : walks her dogs and stays busy at home; She lives with daughter  PLOF: Independent  PATIENT GOALS: avoid surgery  PERTINENT HISTORY:  Parathyroidectomy 07/24/22; Laparoscopic appendectomy 12/06/21; Hernia repair 1993; cholecystectomy 1986; anterior and posterior repair with sacrospinous fixation 2020;  Osteoporosis; skin cancer  BOWEL MOVEMENT: none  URINATION: Pain with urination: No Fully empty bladder: No will have to urinate again after urinating Stream: average Urgency: Yes , able to get to the bathroom in time Frequency: average Leakage: just after urinating Pads: No  INTERCOURSE:   Ability to have vaginal penetration Yes  Pain with intercourse: none DrynessYes  Marinoff Scale: 0/3  PREGNANCY: Vaginal deliveries 2 Tearing Yes: after second child she had to have repair in the vaginal area due to the tear   PROLAPSE: Bulge   OBJECTIVE:  Note: Objective measures were completed at Evaluation unless otherwise noted.  DIAGNOSTIC FINDINGS:  none  PATIENT SURVEYS:  UDI-6 ; 25 - UDI: 33 (06/09/24)  COGNITION: Overall cognitive status: Within functional limits for tasks assessed     SENSATION: Light touch: Appears intact  POSTURE: No Significant postural limitations   LUMBARAROM/PROM:Lumbar ROM is full   LOWER EXTREMITY ROM:  Passive ROM Right eval Left eval  Hip internal rotation 5 5   (Blank rows = not tested)  LOWER EXTREMITY MMT:  MMT Right eval Left eval  Hip flexion 4/5 4/5  Hip extension 4/5 4/5  Hip abduction 3/5 4/5   (Blank rows = not tested) PALPATION:   Abdominal: Decreased movement of the lower rib cage; contract the abdomen and bulge the lower abdomen                External Perineal Exam: decreased mobility of the perineal body; significant dryness and tissue fragility                             Internal Pelvic Floor: tightness in the sides of the introitus along the posterior vaginal wall;  no tightness or tenderness present at reassessment 06/09/24  Patient confirms identification and approves PT to assess internal pelvic floor and treatment Yes No emotional/communication barriers or cognitive limitation. Patient is motivated to learn. Patient understands and agrees with treatment goals and plan. PT explains patient will be  examined in standing, sitting, and lying down to see how their muscles and joints work. When they are ready, they will be asked to remove their underwear so PT can examine their perineum. The patient is also given the option of providing their own chaperone as one is not provided in our facility. The patient also has the right and is explained the right to defer or refuse any part of the evaluation or treatment including the internal exam. With the patient's consent, PT will use one gloved finger to gently assess the  muscles of the pelvic floor, seeing how well it contracts and relaxes and if there is muscle symmetry. After, the patient will get dressed and PT and patient will discuss exam findings and plan of care. PT and patient discuss plan of care, schedule, attendance policy and HEP activities.   PELVIC MMT:   MMT eval 04/27/24 06/09/24  Vaginal 2/5  3/5 with weak hug of therapist finger and lift 1/5 initially; was able to improve to 2/5 with multimodal cues, no lift felt  (Blank rows = not tested)        TONE: Average; average 06/09/24  PROLAPSE: Anterior wall weakness; continued anterior vaginal wall laxity grade 2; bulge with cough 06/09/24  TODAY'S TREATMENT:   06/09/24 RE-EVAL Neuromuscular re-education: Pt provides verbal consent for internal vaginal/rectal pelvic floor exam. Pelvic floor muscle re-evaluation  Pelvic floor muscle contraction training with internal vaginal feedback and multimodal cues to improve coordination HEP update: perform quick flicks 3x/day, once in sitting, seated, and standing with education on starting to make contractions more functional in gravity dependent positions (discussed that when lying down and sitting she can use her own finger in vaginal canal for feedback in order to help improve coordination)  Therapeutic activities: Making sure she is getting estrogen cream externally to vulva and perineal body Using daily vaginal moisturizing cream or coconut  oil Review of pressure management - exhale with more difficult part of activity or exercise Pt education performed on pessaries as a good conservative option prior to surgery: Baseline strength needs to be high enough to help hold pessary in place Different options as far as leaving them in vs one she can remove and place as needed Temporary pessary options like Poise Impressa: 3 sizes given to try as she feels comfortable   05/30/24 Neuromuscular re-education: Core retraining: Supine diaphragmatic breathing 10 x Supine pelvic floor contraction holding for 10 sec 10 times Supine clamshell with band that has 3 dots Bridge 15 x with pelvic floor contraction  Laying on side hip abduction with breathing out on lifting leg and contract the pelvic and abdominals Leaning on the counter and lift alternate arm and leg with core and pelvic floor engaged.      05/23/24 Neuromuscular re-education: Core retraining: Supine with legs on physioball with pelvic floor contraction and not bulging the lower abdomen Supine with legs on physioball with pelvic floor contraction with ball squeeze Supine with legs on physioball with pelvic floor contraction with hip ER with red band  Supine with legs on physioball  diaphragmatic breathing and tactile cues to not bulge the lower abdomen Squat to standing with pelvic floor and lower abdominal contraction to reduce vaginal bulge Therapeutic activities: Functional strengthening activities: Educated patient on double voiding, using diaphragmatic breathing, moving hips to fully empty her bladder.  Educated patient on keeping distance between the rib cage and pubic bone with lifting and gardening to reduce the pressure of the prolapse Educated patient on how lay with her feet elevated to reduce the prolapse when she is feeling a bulge    PATIENT EDUCATION:  05/30/24 Education details: Access Code: [email or web address]; educated on how to fully empty her bladder Person  educated: Patient Education method: Explanation, Demonstration, Tactile cues, Verbal cues, and Handouts Education comprehension: verbalized understanding, returned demonstration, verbal cues required, tactile cues required, and needs further education  HOME EXERCISE PROGRAM: 05/30/24 Access Code: [email or web address] URL: [email or web address] Date: 05/30/2024 Prepared by: Channing Pereyra  Exercises - Supine Pelvic Floor Contraction  - 2 x daily -  7 x weekly - 1 sets - 10 reps - Diaphragmatic Breathing with Hips Elevated (for Pelvic Organ Prolapse)  - 1 x daily - 7 x weekly - 1 sets - 10 reps - Pelvic Floor Muscle Contraction and Pelvic Tilt to Bridge With Hips Elevated (for Pelvic Organ Prolapse)  - 1 x daily - 3 x weekly - 1 sets - 10 reps - 10 sec hold - Pelvic Floor Muscle Contraction and Adductor Squeeze With Hips Elevated (for Pelvic Organ Prolapse)  - 1 x daily - 3 x weekly - 1 sets - 10 reps - Pelvic Floor Muscle Contraction With Resisted Abduction With Hips Elevated (for Pelvic Organ Prolapse)  - 1 x daily - 3 x weekly - 1 sets - 10 reps - Sidelying Pelvic Floor Contraction with Hip Abduction  - 1 x daily - 3 x weekly - 1 sets - 10 reps - Supine Bridge with Pelvic Floor Contraction  - 1 x daily - 3 x weekly - 1 sets - 15 reps - Bird Dog on Counter  - 1 x daily - 3 x weekly - 1 sets - 10 reps  ASSESSMENT:  CLINICAL IMPRESSION: Patient is a 78 y.o. female who was seen today for physical therapy  treatment for post void dribbling. Pt has done very well in pelvic floor physical therapy so far with self-reported improvements of 80%. She states that she will occasionally still have difficulty with complete bladder emptying and in those instances she will leak small amounts of urine after urination. She did demonstrate decreased strength compared to last assessment of pelvic floor muscle strength; believe this is largely coordination/motor control based since with multimodal cues and breath  coordination she was able to quickly improve strength to 2/5. We discussed performing contractions in sitting and standing positions in addition to supine in order to start working in gravity dependent positions to make strength more functional. We also discussed using estrogen cream on vulva and around introitus in order to improve comfort and mobility; also suggested that a daily vulvar/vaginal moisturizer would also help improve her comfort and tissue health. As she expressed concern over having any surgery in the future, she was educated on pessaries and discussed this option with MD in her future visits. Poise impressa sizing kit given to try in order to determine if it is a help temporary option. Patient will benefit from skilled therapy to improve pelvic floor contraction for post void dribbling and pressure management to reduce anterior wall weakness and improve her quality of life.   OBJECTIVE IMPAIRMENTS: decreased coordination, decreased endurance, decreased strength, and increased fascial restrictions.   ACTIVITY LIMITATIONS: continence and toileting  PARTICIPATION LIMITATIONS: shopping and community activity  PERSONAL FACTORS: Parathyroidectomy 07/24/22; Laparoscopic appendectomy 12/06/21; Hernia repair 1993; cholecystectomy 1986; anterior and posterior repair with sacrospinous fixation 2020; Osteoporosis; skin cancer are also affecting patient's functional outcome.   REHAB POTENTIAL: Excellent  CLINICAL DECISION MAKING: Evolving/moderate complexity  EVALUATION COMPLEXITY: Moderate   GOALS: Goals reviewed with patient? Yes  SHORT TERM GOALS: Target date: 05/16/24  Patient independent with initial HEP for pelvic floor and core strength.  Baseline: Goal status: Met 05/30/24  2.  Patient able to open up her lower rib cage for diaphragmatic breathing to work on relaxation of the pelvic floor.  Baseline:  Goal status: Met 05/23/24  3.  Patient understands how to perform double void to  reduce the urinary leakage after urinating.  Baseline:  Goal status: Met 05/23/24   LONG TERM GOALS:  Updated 06/09/24  Patient independent with advanced HEP in order to improve activity tolerance.   Baseline: good progressions  Goal status: IN PROGRESS 06/09/24  2.  Patient understands prolapse management to prevent further progression of the anterior wall weakness.  Baseline: improving understanding  Goal status: IN PROGRESS 06/09/24  3.  Pelvic floor strength is >/= 3/5 holding for 10 seconds to reduce post void dribbling and urine to go on her underwear causing a smell.  Baseline: pelvic floor strength 1/5 today without multimodal cues  Goal status: IN PROGRESS 06/09/24  4.  Patient reports she feels the bulge in the vaginal area 50% less due to improved strength and reduce chance of having surgery.  Baseline: pt reports still feeling bulge, but urinary incontinence is 80% better  Goal status: IN PROGRESS 06/09/24   PLAN:  PT FREQUENCY: 1-2x/week  PT DURATION: 12 weeks  PLANNED INTERVENTIONS: 97110-Therapeutic exercises, 97530- Therapeutic activity, 97112- Neuromuscular re-education, 97535- Self Care, [id number]- Manual therapy, 20560 (1-2 muscles), 20561 (3+ muscles)- Dry Needling, Patient/Family education, Joint mobilization, Spinal mobilization, Cryotherapy, Moist heat, and Biofeedback  PLAN FOR NEXT SESSION:   prolapse management; hip internal rotation; sidely core exercise and quadruped; progress towards standing exercises   Josette Mares, PT, DPT07/31/257:29 PM

## 2024-06-13 ENCOUNTER — Ambulatory Visit: Payer: Self-pay

## 2024-06-17 ENCOUNTER — Encounter: Admitting: Physical Therapy

## 2024-06-22 ENCOUNTER — Ambulatory Visit: Attending: Family | Admitting: Physical Therapy

## 2024-06-22 ENCOUNTER — Encounter: Payer: Self-pay | Admitting: Physical Therapy

## 2024-06-22 DIAGNOSIS — R278 Other lack of coordination: Secondary | ICD-10-CM | POA: Diagnosis not present

## 2024-06-22 DIAGNOSIS — S93505A Unspecified sprain of left lesser toe(s), initial encounter: Secondary | ICD-10-CM | POA: Diagnosis not present

## 2024-06-22 DIAGNOSIS — M6281 Muscle weakness (generalized): Secondary | ICD-10-CM | POA: Insufficient documentation

## 2024-06-22 NOTE — Therapy (Signed)
 OUTPATIENT PHYSICAL THERAPY FEMALE PELVIC TREATMENT   Patient Name: Alexandria Hudson MRN: [id number] DOB:[birth year], 78 y.o., female Today's Date: 06/22/2024  END OF SESSION:  PT End of Session - 06/22/24 1231     Visit Number 6    Date for PT Re-Evaluation 09/01/24    Authorization Type [employer] medicare    Authorization Time Period 06/22/24-09/14/24    Authorization - Visit Number 1    Authorization - Number of Visits 8    Progress Note Due on Visit 10    PT Start Time 1230    PT Stop Time 1310    PT Time Calculation (min) 40 min    Activity Tolerance Patient tolerated treatment well    Behavior During Therapy WFL for tasks assessed/performed           Past Medical History:  Diagnosis Date   Cancer (HCC)    skin cancer   Carpal tunnel syndrome    BOTH WRISTS    Cataracts, both eyes    Family history of adverse reaction to anesthesia    sister problems with N/V   GERD (gastroesophageal reflux disease)    Headache    MIGRAINES   Hyperparathyroidism (HCC)    Osteoporosis    Pneumonia    as a child   PONV (postoperative nausea and vomiting)    NO NAUSEA SINCE TUBES TIED   Past Surgical History:  Procedure Laterality Date   ABDOMINAL HYSTERECTOMY  38 YRS AGO   PARTIAL   ANTERIOR AND POSTERIOR REPAIR WITH SACROSPINOUS FIXATION N/A 01/03/2019   Procedure: ANTERIOR AND POSTERIOR REPAIR WITH SACROSPINOUS FIXATION;  Surgeon: Dannielle Bouchard, DO;  Location: [facility];  Service: Gynecology;  Laterality: N/A;   APPENDECTOMY     CATARACT EXTRACTION, BILATERAL     CHOLECYSTECTOMY  1986   COLONOSCOPY     HERNIA REPAIR  1993   INGUINAL   LAPAROSCOPIC APPENDECTOMY N/A 12/06/2021   Procedure: APPENDECTOMY LAPAROSCOPIC;  Surgeon: Gladis Cough, MD;  Location: [facility];  Service: General;  Laterality: N/A;   PARATHYROIDECTOMY Right 07/24/2022   Procedure: RIGHT PARATHYROIDECTOMY;  Surgeon: Eletha Boas, MD;  Location: [facility];  Service: General;  Laterality: Right;    SKIN CANCER AREAS REMOVED     TONSILLECTOMY     TUBAL LIGATION     Patient Active Problem List   Diagnosis Date Noted   Hyperparathyroidism, primary (HCC) 07/18/2022   Bilateral impacted cerumen 12/27/2019   Prolapse of female pelvic organs 01/03/2019   Pelvic organ prolapse quantification stage 3 rectocele 01/03/2019   Chronic sinusitis 02/10/2017   Gastroesophageal reflux disease 02/10/2017   Hoarseness of voice 02/10/2017   Xerophthalmia 02/10/2017   Xerostomia 02/10/2017    PCP: Chrystal Lamarr RAMAN, MD  REFERRING PROVIDER: Ann Darice HERO, FNP   REFERRING DIAG: [phone] (ICD-10-CM) - Post-void dribbling   THERAPY DIAG:  Muscle weakness (generalized)  Other lack of coordination  Rationale for Evaluation and Treatment: Rehabilitation  ONSET DATE: 1/25  SUBJECTIVE:  SUBJECTIVE STATEMENT: Pt states that as long as she remembers to double void she does not have any urinary leaking usually. She still reports about 80% improvement overall.  Fluid intake: water, soda, coffee  PAIN: 06/09/24 Are you having pain? Yes Pain in bil LB Pain is aching 3/10 Worse when she is on her feet more Better with sitting and rest - sometimes  PRECAUTIONS: Other: skin cancer  RED FLAGS: None   WEIGHT BEARING RESTRICTIONS: No  FALLS:  Has patient fallen in last 6 months? Yes. Number of falls fell over her daughters dog but not due to balance  OCCUPATION: retire  ACTIVITY LEVEL : walks her dogs and stays busy at home; She lives with daughter  PLOF: Independent  PATIENT GOALS: avoid surgery  PERTINENT HISTORY:  Parathyroidectomy 07/24/22; Laparoscopic appendectomy 12/06/21; Hernia repair 1993; cholecystectomy 1986; anterior and posterior repair with sacrospinous fixation 2020; Osteoporosis; skin  cancer  BOWEL MOVEMENT: none  URINATION: Pain with urination: No Fully empty bladder: No will have to urinate again after urinating Stream: average Urgency: Yes , able to get to the bathroom in time Frequency: average Leakage: just after urinating Pads: No  INTERCOURSE:   Ability to have vaginal penetration Yes  Pain with intercourse: none DrynessYes  Marinoff Scale: 0/3  PREGNANCY: Vaginal deliveries 2 Tearing Yes: after second child she had to have repair in the vaginal area due to the tear   PROLAPSE: Bulge   OBJECTIVE:  Note: Objective measures were completed at Evaluation unless otherwise noted.  DIAGNOSTIC FINDINGS:  none  PATIENT SURVEYS:  UDI-6 ; 25 - UDI: 33 (06/09/24)  COGNITION: Overall cognitive status: Within functional limits for tasks assessed     SENSATION: Light touch: Appears intact  POSTURE: No Significant postural limitations   LUMBARAROM/PROM:Lumbar ROM is full   LOWER EXTREMITY ROM:  Passive ROM Right eval Left eval  Hip internal rotation 5 5   (Blank rows = not tested)  LOWER EXTREMITY MMT:  MMT Right eval Left eval  Hip flexion 4/5 4/5  Hip extension 4/5 4/5  Hip abduction 3/5 4/5   (Blank rows = not tested) PALPATION:   Abdominal: Decreased movement of the lower rib cage; contract the abdomen and bulge the lower abdomen                External Perineal Exam: decreased mobility of the perineal body; significant dryness and tissue fragility                             Internal Pelvic Floor: tightness in the sides of the introitus along the posterior vaginal wall;  no tightness or tenderness present at reassessment 06/09/24  Patient confirms identification and approves PT to assess internal pelvic floor and treatment Yes No emotional/communication barriers or cognitive limitation. Patient is motivated to learn. Patient understands and agrees with treatment goals and plan. PT explains patient will be examined in standing,  sitting, and lying down to see how their muscles and joints work. When they are ready, they will be asked to remove their underwear so PT can examine their perineum. The patient is also given the option of providing their own chaperone as one is not provided in our facility. The patient also has the right and is explained the right to defer or refuse any part of the evaluation or treatment including the internal exam. With the patient's consent, PT will use one gloved finger to gently assess the muscles  of the pelvic floor, seeing how well it contracts and relaxes and if there is muscle symmetry. After, the patient will get dressed and PT and patient will discuss exam findings and plan of care. PT and patient discuss plan of care, schedule, attendance policy and HEP activities.   PELVIC MMT:   MMT eval 04/27/24 06/09/24  Vaginal 2/5  3/5 with weak hug of therapist finger and lift 1/5 initially; was able to improve to 2/5 with multimodal cues, no lift felt  (Blank rows = not tested)        TONE: Average; average 06/09/24  PROLAPSE: Anterior wall weakness; continued anterior vaginal wall laxity grade 2; bulge with cough 06/09/24  TODAY'S TREATMENT:   06/22/24 Exercises: Strengthening: Supine pelvic floor contraction holding for 10 sec 10 times with ball squeeze Bridge 15 x with pelvic floor contraction  with ball squeeze Supine clamshell with band that has 3 dots 20 x Laying on side hip abduction with breathing out on lifting leg and contract the pelvic and abdominals 10 x each Therapeutic activities: Functional strengthening activities: Reviewed with patient in the different pessaries and to talk to the doctor Reviewed with patient on estrogen, how it helps with tissue health to wear a pessary Educated patient on the importance of working on her strength of the pelvic floor    06/09/24 RE-EVAL Neuromuscular re-education: Pt provides verbal consent for internal vaginal/rectal pelvic floor  exam. Pelvic floor muscle re-evaluation  Pelvic floor muscle contraction training with internal vaginal feedback and multimodal cues to improve coordination HEP update: perform quick flicks 3x/day, once in sitting, seated, and standing with education on starting to make contractions more functional in gravity dependent positions (discussed that when lying down and sitting she can use her own finger in vaginal canal for feedback in order to help improve coordination)  Therapeutic activities: Making sure she is getting estrogen cream externally to vulva and perineal body Using daily vaginal moisturizing cream or coconut oil Review of pressure management - exhale with more difficult part of activity or exercise Pt education performed on pessaries as a good conservative option prior to surgery: Baseline strength needs to be high enough to help hold pessary in place Different options as far as leaving them in vs one she can remove and place as needed Temporary pessary options like Poise Impressa: 3 sizes given to try as she feels comfortable   05/30/24 Neuromuscular re-education: Core retraining: Supine diaphragmatic breathing 10 x Supine pelvic floor contraction holding for 10 sec 10 times Supine clamshell with band that has 3 dots Bridge 15 x with pelvic floor contraction  Laying on side hip abduction with breathing out on lifting leg and contract the pelvic and abdominals Leaning on the counter and lift alternate arm and leg with core and pelvic floor engaged.       PATIENT EDUCATION:  05/30/24 Education details: Access Code: [email or web address]; educated on how to fully empty her bladder Person educated: Patient Education method: Explanation, Demonstration, Tactile cues, Verbal cues, and Handouts Education comprehension: verbalized understanding, returned demonstration, verbal cues required, tactile cues required, and needs further education  HOME EXERCISE PROGRAM: 05/30/24 Access Code:  [email or web address] URL: [email or web address] Date: 05/30/2024 Prepared by: Channing Pereyra  Exercises - Supine Pelvic Floor Contraction  - 2 x daily - 7 x weekly - 1 sets - 10 reps - Diaphragmatic Breathing with Hips Elevated (for Pelvic Organ Prolapse)  - 1 x daily - 7 x weekly - 1 sets - 10 reps - Pelvic Floor  Muscle Contraction and Pelvic Tilt to Bridge With Hips Elevated (for Pelvic Organ Prolapse)  - 1 x daily - 3 x weekly - 1 sets - 10 reps - 10 sec hold - Pelvic Floor Muscle Contraction and Adductor Squeeze With Hips Elevated (for Pelvic Organ Prolapse)  - 1 x daily - 3 x weekly - 1 sets - 10 reps - Pelvic Floor Muscle Contraction With Resisted Abduction With Hips Elevated (for Pelvic Organ Prolapse)  - 1 x daily - 3 x weekly - 1 sets - 10 reps - Sidelying Pelvic Floor Contraction with Hip Abduction  - 1 x daily - 3 x weekly - 1 sets - 10 reps - Supine Bridge with Pelvic Floor Contraction  - 1 x daily - 3 x weekly - 1 sets - 15 reps - Bird Dog on Counter  - 1 x daily - 3 x weekly - 1 sets - 10 reps  ASSESSMENT:  CLINICAL IMPRESSION: Patient is a 78 y.o. female who was seen today for physical therapy  treatment for post void dribbling. Pt has done very well in pelvic floor physical therapy so far with self-reported improvements of 80%. She states that she will occasionally still have difficulty with complete bladder emptying and in those instances she will leak small amounts of urine after urination. She did demonstrate decreased strength compared to last assessment of pelvic floor muscle strength; believe this is largely coordination/motor control based since with multimodal cues and breath coordination she was able to quickly improve strength to 2/5. We discussed performing contractions in sitting and standing positions in addition to supine in order to start working in gravity dependent positions to make strength more functional. We also discussed using estrogen cream on vulva and around  introitus in order to improve comfort and mobility; also suggested that a daily vulvar/vaginal moisturizer would also help improve her comfort and tissue health. As she expressed concern over having any surgery in the future, she was educated on pessaries and discussed this option with MD in her future visits.   OBJECTIVE IMPAIRMENTS: decreased coordination, decreased endurance, decreased strength, and increased fascial restrictions.   ACTIVITY LIMITATIONS: continence and toileting  PARTICIPATION LIMITATIONS: shopping and community activity  PERSONAL FACTORS: Parathyroidectomy 07/24/22; Laparoscopic appendectomy 12/06/21; Hernia repair 1993; cholecystectomy 1986; anterior and posterior repair with sacrospinous fixation 2020; Osteoporosis; skin cancer are also affecting patient's functional outcome.   REHAB POTENTIAL: Excellent  CLINICAL DECISION MAKING: Evolving/moderate complexity  EVALUATION COMPLEXITY: Moderate   GOALS: Goals reviewed with patient? Yes  SHORT TERM GOALS: Target date: 05/16/24  Patient independent with initial HEP for pelvic floor and core strength.  Baseline: Goal status: Met 05/30/24  2.  Patient able to open up her lower rib cage for diaphragmatic breathing to work on relaxation of the pelvic floor.  Baseline:  Goal status: Met 05/23/24  3.  Patient understands how to perform double void to reduce the urinary leakage after urinating.  Baseline:  Goal status: Met 05/23/24   LONG TERM GOALS: Updated 06/09/24  Patient independent with advanced HEP in order to improve activity tolerance.   Baseline: good progressions  Goal status: Met 06/22/24  2.  Patient understands prolapse management to prevent further progression of the anterior wall weakness.  Baseline: improving understanding  Goal status: met 06/22/24  3.  Pelvic floor strength is >/= 3/5 holding for 10 seconds to reduce post void dribbling and urine to go on her underwear causing a smell.  Baseline:  pelvic floor strength 1/5 today  without multimodal cues  Goal status: not met 06/22/24  4.  Patient reports she feels the bulge in the vaginal area 50% less due to improved strength and reduce chance of having surgery.  Baseline: pt reports still feeling bulge, but urinary incontinence is 80% better  Goal status:Met 06/22/24   PLAN: Discharge to HEP this visit.     Channing Pereyra, PT 06/22/24 12:35 PM   PHYSICAL THERAPY DISCHARGE SUMMARY  Visits from Start of Care: 6  Current functional level related to goals / functional outcomes: See above. Patient will continues with her HEP to further strengthen her pelvic floor .   Remaining deficits: See above.    Education / Equipment: HEP   Patient agrees to discharge. Patient goals were partially met. Patient is being discharged due to being pleased with the current functional level. Thank you for the referral.   Channing Pereyra, PT 06/22/24 1:02 PM

## 2024-07-04 DIAGNOSIS — L570 Actinic keratosis: Secondary | ICD-10-CM | POA: Diagnosis not present

## 2024-07-06 DIAGNOSIS — M21612 Bunion of left foot: Secondary | ICD-10-CM | POA: Diagnosis not present

## 2024-07-06 DIAGNOSIS — S93505A Unspecified sprain of left lesser toe(s), initial encounter: Secondary | ICD-10-CM | POA: Diagnosis not present

## 2024-07-13 ENCOUNTER — Encounter

## 2024-07-27 ENCOUNTER — Encounter: Admitting: Gastroenterology

## 2024-08-16 ENCOUNTER — Ambulatory Visit: Admitting: *Deleted

## 2024-08-16 ENCOUNTER — Encounter: Payer: Self-pay | Admitting: Gastroenterology

## 2024-08-16 VITALS — Ht 63.0 in | Wt 126.0 lb

## 2024-08-16 DIAGNOSIS — Z860100 Personal history of colon polyps, unspecified: Secondary | ICD-10-CM

## 2024-08-16 MED ORDER — SUTAB 1479-225-188 MG PO TABS
ORAL_TABLET | ORAL | 0 refills | Status: DC
Start: 2024-08-16 — End: 2024-08-30

## 2024-08-16 NOTE — Progress Notes (Signed)
 Pt's name and DOB verified at the beginning of the pre-visit with 2 identifiers  Permission given to speak with  Pt denies any difficulty with ambulating,sitting, laying down or rolling side to side  Pt has no issues moving head neck or swallowing  No egg or soy allergy known to patient   HX PONV  No FH of Malignant Hyperthermia  Pt is not on home 02   Pt is not on blood thinners   Pt denies issues with constipation   Pt has frequent issues with constipation RN instructed pt to use Miralax per bottles instructions a week before prep days. Pt states they will  Pt is not on dialysis  Pt denise any abnormal heart rhythms   Pt denies any upcoming cardiac testing  Patient's chart reviewed by Norleen Schillings CNRA prior to pre-visit and patient appropriate for the [facility].  Pre-visit completed and red dot placed by patient's name on their procedure day (on provider's schedule).     Visit by phone  Pt states weight is 126 lb  Pt given  both [facility] main # and MD on call # prior to instructions.  Informed pt to come in at the time discussed and is shown on PV instructions.  Pt instructed to use Single[employer].com or GoodRx for a price reduction on prep  Instructed pt where to find PV instructions in My [facility]. Copy of instructions  to be sent in mail and address read back to pt to verify correct on envelope. Instructed pt on all aspects of written instructions including med holds clothing to wear and foods to eat and not eat as well as after procedure legal restrictions and to call MD on call if needed.. Pt states understanding. Instructed pt to review instructions again prior to procedure and call main # given if has any questions or any issues. Pt states they will.

## 2024-08-25 ENCOUNTER — Telehealth: Payer: Self-pay | Admitting: Gastroenterology

## 2024-08-25 NOTE — Telephone Encounter (Signed)
 Inbound call from patient stating she would like to speak to nurse in regards to questions on medication she's on and her upcoming procedure on 08/30/24. Please advise  Thank you

## 2024-08-25 NOTE — Telephone Encounter (Signed)
 Reviewed medications and supplements with patient.  Answered all questions and patient verbally understood all instructions for upcoming procedure.

## 2024-08-30 ENCOUNTER — Encounter: Payer: Self-pay | Admitting: Gastroenterology

## 2024-08-30 ENCOUNTER — Ambulatory Visit (AMBULATORY_SURGERY_CENTER): Admitting: Gastroenterology

## 2024-08-30 VITALS — BP 132/73 | HR 71 | Temp 97.9°F | Resp 13 | Ht 63.0 in | Wt 126.0 lb

## 2024-08-30 DIAGNOSIS — Z860101 Personal history of adenomatous and serrated colon polyps: Secondary | ICD-10-CM | POA: Diagnosis not present

## 2024-08-30 DIAGNOSIS — D123 Benign neoplasm of transverse colon: Secondary | ICD-10-CM

## 2024-08-30 DIAGNOSIS — K648 Other hemorrhoids: Secondary | ICD-10-CM | POA: Diagnosis not present

## 2024-08-30 DIAGNOSIS — Z1211 Encounter for screening for malignant neoplasm of colon: Secondary | ICD-10-CM | POA: Diagnosis not present

## 2024-08-30 DIAGNOSIS — R197 Diarrhea, unspecified: Secondary | ICD-10-CM

## 2024-08-30 DIAGNOSIS — K635 Polyp of colon: Secondary | ICD-10-CM | POA: Diagnosis not present

## 2024-08-30 DIAGNOSIS — K5730 Diverticulosis of large intestine without perforation or abscess without bleeding: Secondary | ICD-10-CM

## 2024-08-30 DIAGNOSIS — D122 Benign neoplasm of ascending colon: Secondary | ICD-10-CM

## 2024-08-30 DIAGNOSIS — E785 Hyperlipidemia, unspecified: Secondary | ICD-10-CM | POA: Diagnosis not present

## 2024-08-30 DIAGNOSIS — Z860100 Personal history of colon polyps, unspecified: Secondary | ICD-10-CM

## 2024-08-30 MED ORDER — SODIUM CHLORIDE 0.9 % IV SOLN: 500.0000 mL | INTRAVENOUS | Status: DC

## 2024-08-30 NOTE — Patient Instructions (Addendum)

## 2024-08-30 NOTE — Progress Notes (Signed)
 [facility] History and Physical   Primary Care Physician:  Chrystal Lamarr RAMAN, MD   Reason for Procedure:   History of colon polyps  Plan:    colonoscopy     HPI: Alexandria Hudson is a 78 y.o. female  here for colonoscopy surveillance - history of numerous SSPs removed in the past (14 in 2021), last exam in 2022 had SSP invading the appendix s/p appendectomy.    Patient does have intermittent persistent loose stools. Otherwise feels well without any cardiopulmonary symptoms.   I have discussed risks / benefits of anesthesia and endoscopic procedure with Alexandria Hudson and they Hudson to proceed with the exams as outlined today.    Past Medical History:  Diagnosis Date   Cancer (HCC)    skin cancer   Carpal tunnel syndrome    BOTH WRISTS    Cataracts, both eyes    Family history of adverse reaction to anesthesia    sister problems with N/V   GERD (gastroesophageal reflux disease)    Headache    MIGRAINES   Hyperlipidemia    Hyperparathyroidism    Osteoporosis    Pneumonia    as a child   PONV (postoperative nausea and vomiting)    NO NAUSEA SINCE TUBES TIED    Past Surgical History:  Procedure Laterality Date   ABDOMINAL HYSTERECTOMY  38 YRS AGO   PARTIAL   ANTERIOR AND POSTERIOR REPAIR WITH SACROSPINOUS FIXATION N/A 01/03/2019   Procedure: ANTERIOR AND POSTERIOR REPAIR WITH SACROSPINOUS FIXATION;  Surgeon: Dannielle Bouchard, DO;  Location: [facility];  Service: Gynecology;  Laterality: N/A;   APPENDECTOMY     CATARACT EXTRACTION, BILATERAL     CHOLECYSTECTOMY  1986   COLONOSCOPY     HERNIA REPAIR  1993   INGUINAL   LAPAROSCOPIC APPENDECTOMY N/A 12/06/2021   Procedure: APPENDECTOMY LAPAROSCOPIC;  Surgeon: Gladis Cough, MD;  Location: [facility];  Service: General;  Laterality: N/A;   PARATHYROIDECTOMY Right 07/24/2022   Procedure: RIGHT PARATHYROIDECTOMY;  Surgeon: Eletha Boas, MD;  Location: [facility];  Service: General;  Laterality:  Right;   SKIN CANCER AREAS REMOVED     TONSILLECTOMY     TUBAL LIGATION      Prior to Admission medications   Medication Sig Start Date End Date Taking? Authorizing Provider  ibuprofen  (ADVIL ) 100 MG/5ML suspension Take 200 mg by mouth every 4 (four) hours as needed.   Yes [provider]  Magnesium 100 MG TABS Take by mouth.   Yes [provider]  OVER THE COUNTER MEDICATION Take 6 oz by mouth daily. ASEA Redox Cell Signaling Supplement   Yes [provider]  Polyethyl Glycol-Propyl Glycol (LUBRICANT EYE DROPS) 0.4-0.3 % SOLN Place 1-2 drops into both eyes 3 (three) times daily as needed (dry/irritated eyes.).   Yes [provider]  Acetylcysteine (NAC PO) Take 1 tablet by mouth 3 (three) times a week.    [provider]  BIOTIN PO Take 1 tablet by mouth daily.    [provider]  Cholecalciferol (VITAMIN D3) 50 MCG (2000 UT) TABS Take 1,000 Units by mouth every other day.    [provider]  Coenzyme Q10 (COQ10 PO) Take 100 mg by mouth daily.    [provider]  cyanocobalamin (VITAMIN B12) 500 MCG tablet Take 500 mcg by mouth daily.    [provider]  famotidine  (PEPCID ) 20 MG tablet Take 20 mg by mouth daily as needed for heartburn or indigestion.  [provider]  hydrocortisone 2.5 % cream Apply 1 Application topically as needed for hemorrhoids. 04/02/22   [provider]  milk thistle 175 MG tablet Take 175 mg by mouth daily.    [provider]  Multiple Vitamins-Minerals (ICAPS AREDS 2 PO) Take by mouth.    [provider]  Multiple Vitamins-Minerals (MULTIVITAMIN ADULTS PO) Take by mouth.    [provider]  Quercetin 250 MG TABS Take 500 mg by mouth 3 (three) times a week.    [provider]  vitamin C (ASCORBIC ACID) 250 MG tablet Take 250 mg by mouth daily.    [provider]  VITAMIN K PO Take 1 tablet by mouth every other day. With  vitamin d3    [provider]    Current Outpatient Medications  Medication Sig Dispense Refill   ibuprofen  (ADVIL ) 100 MG/5ML suspension Take 200 mg by mouth every 4 (four) hours as needed.     Magnesium 100 MG TABS Take by mouth.     OVER THE COUNTER MEDICATION Take 6 oz by mouth daily. ASEA Redox Cell Signaling Supplement     Polyethyl Glycol-Propyl Glycol (LUBRICANT EYE DROPS) 0.4-0.3 % SOLN Place 1-2 drops into both eyes 3 (three) times daily as needed (dry/irritated eyes.).     Acetylcysteine (NAC PO) Take 1 tablet by mouth 3 (three) times a week.     BIOTIN PO Take 1 tablet by mouth daily.     Cholecalciferol (VITAMIN D3) 50 MCG (2000 UT) TABS Take 1,000 Units by mouth every other day.     Coenzyme Q10 (COQ10 PO) Take 100 mg by mouth daily.     cyanocobalamin (VITAMIN B12) 500 MCG tablet Take 500 mcg by mouth daily.     famotidine  (PEPCID ) 20 MG tablet Take 20 mg by mouth daily as needed for heartburn or indigestion.     hydrocortisone 2.5 % cream Apply 1 Application topically as needed for hemorrhoids.     milk thistle 175 MG tablet Take 175 mg by mouth daily.     Multiple Vitamins-Minerals (ICAPS AREDS 2 PO) Take by mouth.     Multiple Vitamins-Minerals (MULTIVITAMIN ADULTS PO) Take by mouth.     Quercetin 250 MG TABS Take 500 mg by mouth 3 (three) times a week.     vitamin C (ASCORBIC ACID) 250 MG tablet Take 250 mg by mouth daily.     VITAMIN K PO Take 1 tablet by mouth every other day. With vitamin d3     Current Facility-Administered Medications  Medication Dose Route Frequency Provider Last Rate Last Admin   0.9 %  sodium chloride  infusion  500 mL Intravenous Continuous Bode Pieper, Elspeth SQUIBB, MD        Allergies as of 08/30/2024 - Review Complete 08/30/2024  Allergen Reaction Noted   Betadine [povidone-iodine] Other (See Comments)    Tylenol  [acetaminophen ] Palpitations 01/03/2019    Family History  Problem Relation Age of Onset   Colon cancer Father     Crohn's disease Father    Esophageal cancer Neg Hx    Rectal cancer Neg Hx    Stomach cancer Neg Hx    Breast cancer Neg Hx    Colon polyps Neg Hx     Social History   Socioeconomic History   Marital status: Divorced    Spouse name: Not on file   Number of children: Not on file   Years of education: Not on file   Highest education level: Not on  file  Occupational History   Not on file  Tobacco Use   Smoking status: Never   Smokeless tobacco: Never  Vaping Use   Vaping status: Never Used  Substance and Sexual Activity   Alcohol use: Yes    Comment: OCC   Drug use: Never   Sexual activity: Not on file  Other Topics Concern   Not on file  Social History Narrative   Not on file   Social Drivers of Health   Financial Resource Strain: Not on file  Food Insecurity: Low Risk  (04/21/2023)   Received from [facility]   Hunger Vital Sign    Within the past 12 months, you worried that your food would run out before you got money to buy more: Never true    Within the past 12 months, the food you bought just didn't last and you didn't have money to get more. : Never true  Transportation Needs: Not on file (04/21/2023)  Physical Activity: Not on file  Stress: Not on file  Social Connections: Not on file  Intimate Partner Violence: Not on file    Review of Systems: All other review of systems negative except as mentioned in the HPI.  Physical Exam: Vital signs BP 138/78   Pulse 74   Temp 97.9 F (36.6 C)   Ht 5' 3 (1.6 m)   Wt 126 lb (57.2 kg)   SpO2 98%   BMI 22.32 kg/m   General:   Alert,  Well-developed, pleasant and cooperative in NAD Lungs:  Clear throughout to auscultation.   Heart:  Regular rate and rhythm Abdomen:  Soft, nontender and nondistended.   Neuro/Psych:  Alert and cooperative. Normal mood and affect. A and O x 3  Marcey Naval, MD [facility]

## 2024-08-30 NOTE — Op Note (Signed)
 [facility] Patient Name: Alexandria Hudson Procedure Date: 08/30/2024 10:01 AM MRN: [id number] Endoscopist: Elspeth P. Leigh , MD, [id number] Age: 78 Referring MD:  Date of Birth: [birth year] Gender: Female Account #: [id number] Procedure:                Colonoscopy Indications:              High risk colon cancer surveillance: Personal                            history of colonic polyps - 14 SSPs removed 01/2020,                            last exam 07/2021 - had SSP invading the AO now s/p                            appendectomy. Has intermittent chronic loose stools                            as well. Medicines:                Monitored Anesthesia Care Procedure:                Pre-Anesthesia Assessment:                           - Prior to the procedure, a History and Physical                            was performed, and patient medications and                            allergies were reviewed. The patient's tolerance of                            previous anesthesia was also reviewed. The risks                            and benefits of the procedure and the sedation                            options and risks were discussed with the patient.                            All questions were answered, and informed consent                            was obtained. Prior Anticoagulants: The patient has                            taken no anticoagulant or antiplatelet agents. ASA                            Grade Assessment: II - A patient with mild systemic  disease. After reviewing the risks and benefits,                            the patient was deemed in satisfactory condition to                            undergo the procedure.                           After obtaining informed consent, the colonoscope                            was passed under direct vision. Throughout the                            procedure, the patient's blood pressure,  pulse, and                            oxygen saturations were monitored continuously. The                            Olympus Scope SN [id number] was introduced through the                            anus and advanced to the the cecum, identified by                            appendiceal orifice and ileocecal valve. The                            colonoscopy was performed without difficulty. The                            patient tolerated the procedure well. The quality                            of the bowel preparation was good. The ileocecal                            valve, appendiceal orifice, and rectum were                            photographed. Scope In: 10:22:35 AM Scope Out: 10:45:48 AM Scope Withdrawal Time: 0 hours 18 minutes 28 seconds  Total Procedure Duration: 0 hours 23 minutes 13 seconds  Findings:                 The perianal and digital rectal examinations were                            normal.                           The terminal ileum appeared normal.  A 3 mm polyp was found in the ascending colon. The                            polyp was flat. The polyp was removed with a cold                            snare. Resection and retrieval were complete.                           A 3 mm polyp was found in the hepatic flexure. The                            polyp was flat. The polyp was removed with a cold                            snare. Resection and retrieval were complete.                           Three sessile polyps were found in the transverse                            colon. The polyps were 2 to 5 mm in size. These                            polyps were removed with a cold snare. Resection                            and retrieval were complete.                           A 3 mm polyp was found in the splenic flexure. The                            polyp was flat. The polyp was removed with a cold                            snare.  Resection and retrieval were complete.                           Multiple diverticula were found in the entire colon.                           Internal hemorrhoids were found during retroflexion.                           The exam was otherwise without abnormality on                            direct and retroflexion views.                           Biopsies for histology were taken with a cold  forceps from the right colon, left colon and                            transverse colon for evaluation of microscopic                            colitis. Complications:            No immediate complications. Estimated blood loss:                            Minimal. Estimated Blood Loss:     Estimated blood loss was minimal. Impression:               - The examined portion of the ileum was normal.                           - One 3 mm polyp in the ascending colon, removed                            with a cold snare. Resected and retrieved.                           - One 3 mm polyp at the hepatic flexure, removed                            with a cold snare. Resected and retrieved.                           - Three 2 to 5 mm polyps in the transverse colon,                            removed with a cold snare. Resected and retrieved.                           - One 3 mm polyp at the splenic flexure, removed                            with a cold snare. Resected and retrieved.                           - Diverticulosis in the entire examined colon.                           - Internal hemorrhoids.                           - The examination was otherwise normal on direct                            and retroflexion views.                           - Biopsies were taken with a cold forceps from the  right colon, left colon and transverse colon for                            evaluation of microscopic colitis. Recommendation:           - Patient has a  contact number available for                            emergencies. The signs and symptoms of potential                            delayed complications were discussed with the                            patient. Return to normal activities tomorrow.                            Written discharge instructions were provided to the                            patient.                           - Resume previous diet.                           - Continue present medications.                           - Await pathology results. Elspeth P. Ian Cavey, MD 08/30/2024 10:52:35 AM This report has been signed electronically.

## 2024-08-30 NOTE — Progress Notes (Signed)
 I have reviewed the patient's medical history in detail and updated the computerized patient record.

## 2024-08-30 NOTE — Progress Notes (Signed)
 To pacu, VSS. Report to [email or web address]

## 2024-08-30 NOTE — Progress Notes (Signed)
 Called to room to assist during endoscopic procedure.  Patient ID and intended procedure confirmed with present staff. Received instructions for my participation in the procedure from the performing physician.

## 2024-08-31 ENCOUNTER — Telehealth: Payer: Self-pay | Admitting: *Deleted

## 2024-08-31 NOTE — Telephone Encounter (Signed)
  Follow up Call-     08/30/2024    9:51 AM  Call back number  Post procedure Call Back phone  # [phone]  Permission to leave phone message Yes   Left message to call back if any questions or concerns

## 2024-09-01 ENCOUNTER — Ambulatory Visit: Payer: Self-pay | Admitting: Gastroenterology

## 2024-09-01 LAB — SURGICAL PATHOLOGY

## 2024-10-18 ENCOUNTER — Ambulatory Visit: Admitting: Nurse Practitioner

## 2024-10-18 ENCOUNTER — Encounter: Payer: Self-pay | Admitting: Nurse Practitioner

## 2024-10-18 VITALS — BP 124/78 | HR 66 | Ht 63.0 in | Wt 131.0 lb

## 2024-10-18 DIAGNOSIS — K529 Noninfective gastroenteritis and colitis, unspecified: Secondary | ICD-10-CM

## 2024-10-18 NOTE — Progress Notes (Signed)
 Agree with assessment and plan as outlined.

## 2024-10-18 NOTE — Patient Instructions (Addendum)
  Take Lactaid 2 tabs with each dairy product   Use Lactose free milk   Benefiber one tablespoon once daily as tolerated to bulk up stool  Consider trying Cholestyramine if loose stools increase. Contact Colleen NP if you wish to try Cholestyramine.   _______________________________________________________  If your blood pressure at your visit was 140/90 or greater, please contact your primary care physician to follow up on this.  _______________________________________________________  If you are age 60 or older, your body mass index should be between 23-30. Your Body mass index is 23.21 kg/m. If this is out of the aforementioned range listed, please consider follow up with your Primary Care Provider.  If you are age 48 or younger, your body mass index should be between 19-25. Your Body mass index is 23.21 kg/m. If this is out of the aformentioned range listed, please consider follow up with your Primary Care Provider.   ________________________________________________________  The [facility] providers would like to encourage you to use MYCHART to communicate with providers for non-urgent requests or questions.  Due to long hold times on the telephone, sending your provider a message by [facility] may be a faster and more efficient way to get a response.  Please allow 48 business hours for a response.  Please remember that this is for non-urgent requests.  _______________________________________________________  [facility] is using a team-based approach to care.  Your team is made up of your doctor and two to three APPS. Our APPS (Nurse Practitioners and Physician Assistants) work with your physician to ensure care continuity for you. They are fully qualified to address your health concerns and develop a treatment plan. They communicate directly with your gastroenterologist to care for you. Seeing the Advanced Practice Practitioners on your physician's team can help you by  facilitating care more promptly, often allowing for earlier appointments, access to diagnostic testing, procedures, and other specialty referrals.

## 2024-10-18 NOTE — Progress Notes (Signed)
 10/18/2024 Alexandria Hudson [id number] [birth year]   Chief Complaint: Loose stools   History of Present Illness: Alexandria Hudson is a 78 year old female with a past medical history of migraine headaches, osteoporosis, hyperparathyroidism s/p parathyroidectomy, IBS and colon polyps.  Past cholecystectomy, inguinal hernia repair and laparoscopic appendectomy.  She is known by Dr. Leigh.  Discussed the use of AI scribe software for clinical note transcription with the patient, who gave verbal consent to proceed.  History of Present Illness Alexandria Hudson is a 78 year old female with a history of precancerous polyps who presents with loose stools.  She experiences loose stools approximately once a week, often triggered by dietary factors such as lactose-containing foods and caffeine.  No abdominal pain.  She has been using lactose-free milk and since then her diarrhea has improved, occurs once weekly.  She consumes two cups of coffee daily and suspects it may contribute to her symptoms. No abdominal pain is present, but there is an urgent need to defecate when experiencing loose stools.   Her bowel movements occur about four times a day, which she finds unusual, although the stools are typically regular. She frequently experiences gas. She has not been on antibiotics in the past three to four months. She has not been using any fiber supplements.  Celiac serology was negative 09/29/2022.  Her most recent colonoscopy was 08/30/2024 which identified 6 polyps removed from the colon, path report consistent with sessile serrated and tubular adenomas and random colon biopsies were negative for microscopic colitis or IBD.  Due to her history of colon polyps she was advised repeat a colonoscopy in 3 years.  She is concerned about the frequency and nature of her polyps, given her age and history.    Colonoscopy 07/26/2021 identified a polypoid lesion prolapsing out of the appendiceal  orifice and 3 to 4 mm polyp was removed from the transverse colon.  Biopsies of the appendical orifice showed sessile serrated polyp without dysplasia and the transverse polyp was benign colonic mucosa with lymphoid aggregates.    Colonoscopy 01/18/2020 identified 14 also serrated and hyperplastic polyps removed from the colon.  She has a history of parathyroid  surgery and basal cell cancer removal. She also reports swelling in her legs due to varicose veins and is considering vein surgery.      Latest Ref Rng & Units 11/18/2021    1:17 PM 01/07/2019   12:06 AM 01/04/2019    6:05 AM  CBC  WBC 4.0 - 10.5 K/uL 6.7  13.9  14.7   Hemoglobin 12.0 - 15.0 g/dL 86.5  87.7  86.5   Hematocrit 36.0 - 46.0 % 42.3  38.3  42.5   Platelets 150 - 400 K/uL 272  279  278        Latest Ref Rng & Units 01/07/2019   12:06 AM 01/04/2019    6:05 AM 12/29/2018    1:37 PM  CMP  Glucose 70 - 99 mg/dL 870  888  86   BUN 8 - 23 mg/dL 10  10  14    Creatinine 0.44 - 1.00 mg/dL 9.38  9.40  9.33   Sodium 135 - 145 mmol/L 135  141  141   Potassium 3.5 - 5.1 mmol/L 4.1  4.0  4.1   Chloride 98 - 111 mmol/L 103  107  108   CO2 22 - 32 mmol/L 24  28  28    Calcium 8.9 - 10.3 mg/dL 89.9  89.7  89.8  Total Protein 6.5 - 8.1 g/dL  6.9  7.0   Total Bilirubin 0.3 - 1.2 mg/dL  0.8  0.7   Alkaline Phos 38 - 126 U/L  67  66   AST 15 - 41 U/L  19  21   ALT 0 - 44 U/L  17  17     Endoscopic history:  Colonoscopy 01/18/20 - The perianal and digital rectal examinations were normal. - Two flat polyps were found in the cecum. The polyps were 3 to 7 mm in size. These polyps were removed with a cold snare. Resection and retrieval were complete. - Five flat and sessile polyps were found in the ascending colon. The polyps were 3 to 8 mm in size. These polyps were removed with a cold snare, one of which could only be grasped and removed in the retroflexed position. Resection and retrieval were complete. - Four flat polyps were found in  the transverse colon. The polyps were 3 to 12 mm in size. These polyps were removed with a cold snare. Resection and retrieval were complete. - Two flat polyps were found in the splenic flexure. The polyps were 4 to 6 mm in size. These polyps were removed with a cold snare. Resection and retrieval were complete. - A 4 mm polyp was found in the descending colon. The polyp was flat. The polyp was removed with a cold snare. Resection and retrieval were complete. - Many medium-mouthed diverticula were found in the transverse colon and left colon. - Internal hemorrhoids were found during retroflexion. The hemorrhoids were moderate. - The exam was otherwise without abnormality.   Surgical [P], colon, transverse, cecum and ascending, splenic flexure, descending, polyp (14) - SESSILE SERRATED POLYP (MULTIPLE FRAGMENTS). - HYPERPLASTIC POLYP. - NO DYSPLASIA OR MALIGNANCY.    Colonoscopy 07/26/21: - The perianal and digital rectal examinations were normal. - The terminal ileum appeared normal. - A polypoid lesion was found intermittently prolapsing out of the appendiceal orifice. Initially the AO appeared normal but then a polypoid flat lesion prolapsed out of it, could not see clear margins, and appeared to invade the appendix, suspect sessile serrated lesion. Biopsies were taken with a cold forceps for histology. - A 3 to 4 mm polyp was found in the transverse colon. The polyp was flat. The polyp was removed with a cold snare. Resection and retrieval were complete. - Multiple medium-mouthed diverticula were found in the entire colon. - Internal hemorrhoids were found during retroflexion. - The exam was otherwise without abnormality.   1. Surgical [P], appendiceal orifice polyp biopsies - SESSILE SERRATED POLYP. - NO DYSPLASIA OR MALIGNANCY. 2. Surgical [P], colon, transverse, polyp (1) - BENIGN COLONIC MUCOSA WITH LYMPHOID AGGREGATE. - NO DYPLASIA OR MALIGNANCY.   Led to appendectomy  11/2021 FINAL MICROSCOPIC DIAGNOSIS:   A. APPENDIX, APPENDECTOMY:  - Appendix with a benign serrated polyp  - Negative for cytologic dysplasia or malignancy    Colonoscopy 08/30/2024: - The examined portion of the ileum was normal.  - One 3 mm polyp in the ascending colon, removed with a cold snare. Resected and retrieved.  - One 3 mm polyp at the hepatic flexure, removed with a cold snare. Resected and retrieved.  - Three 2 to 5 mm polyps in the transverse colon, removed with a cold snare. Resected and retrieved.  - One 3 mm polyp at the splenic flexure, removed with a cold snare. Resected and retrieved.  - Diverticulosis in the entire examined colon.  - Internal hemorrhoids.  -  The examination was otherwise normal on direct and retroflexion views.  - Biopsies were taken with a cold forceps from the right colon, left colon and transverse colon for evaluation of microscopic colitis. - 3 year recall colonoscopy   1. Surgical [P], random colon :       - BENIGN COLONIC MUCOSA WITH NO SPECIFIC PATHOLOGIC CHANGE.        2. Surgical [P], colon, hepatic flexure, ascending, transverse, splenic flexure, polyp (5) :       - FRAGMENTS OF SESSILE SERRATED POLYP WITHOUT CYTOLOGIC DYSPLASIA.       - TUBULAR ADENOMA.   Current Outpatient Medications on File Prior to Visit  Medication Sig Dispense Refill   Acetylcysteine (NAC PO) Take 1 tablet by mouth 3 (three) times a week.     BIOTIN PO Take 1 tablet by mouth daily. (Patient taking differently: Take 1 tablet by mouth daily. Once a week)     Cholecalciferol (VITAMIN D3) 50 MCG (2000 UT) TABS Take 1,000 Units by mouth every other day.     cyanocobalamin (VITAMIN B12) 500 MCG tablet Take 500 mcg by mouth daily.     famotidine  (PEPCID ) 20 MG tablet Take 20 mg by mouth daily as needed for heartburn or indigestion.     hydrocortisone 2.5 % cream Apply 1 Application topically as needed for hemorrhoids.     ibuprofen  (ADVIL ) 100 MG/5ML suspension Take  200 mg by mouth every 4 (four) hours as needed.     Magnesium 100 MG TABS Take by mouth.     milk thistle 175 MG tablet Take 175 mg by mouth daily. (Patient taking differently: Take 175 mg by mouth daily. 3 times a week)     Multiple Vitamins-Minerals (ICAPS AREDS 2 PO) Take by mouth.     Multiple Vitamins-Minerals (MULTIVITAMIN ADULTS PO) Take by mouth.     OVER THE COUNTER MEDICATION Take 6 oz by mouth daily. ASEA Redox Cell Signaling Supplement     Polyethyl Glycol-Propyl Glycol (LUBRICANT EYE DROPS) 0.4-0.3 % SOLN Place 1-2 drops into both eyes 3 (three) times daily as needed (dry/irritated eyes.).     Quercetin 250 MG TABS Take 500 mg by mouth 3 (three) times a week.     vitamin C (ASCORBIC ACID) 250 MG tablet Take 250 mg by mouth daily.     VITAMIN K PO Take 1 tablet by mouth every other day. With vitamin d3 (Patient taking differently: Take 1 tablet by mouth every other day. With vitamin D 3 taking 3 time a week)     Coenzyme Q10 (COQ10 PO) Take 100 mg by mouth daily. (Patient not taking: Reported on 10/18/2024)     No current facility-administered medications on file prior to visit.   Allergies  Allergen Reactions   Betadine [Povidone-Iodine] Other (See Comments)    skin irritation   Tylenol  [Acetaminophen ] Palpitations   Current Medications, Allergies, Past Medical History, Past Surgical History, Family History and Social History were reviewed in [employer] record.  Review of Systems:   Constitutional: Negative for fever, sweats, chills or weight loss.  Respiratory: Negative for shortness of breath.   Cardiovascular: Negative for chest pain, palpitations and leg swelling.  Gastrointestinal: See HPI.  Musculoskeletal: Negative for back pain or muscle aches.  Neurological: Negative for dizziness, headaches or paresthesias.   Physical Exam: BP 124/78   Pulse 66   Ht 5' 3 (1.6 m)   Wt 131 lb (59.4 kg)   BMI 23.21 kg/m  Wt Readings from Last 3  Encounters:  10/18/24 131 lb (59.4 kg)  08/30/24 126 lb (57.2 kg)  08/16/24 126 lb (57.2 kg)    General: 78 year old female in no acute distress. Head: Normocephalic and atraumatic. Eyes: No scleral icterus. Conjunctiva pink . Ears: Normal auditory acuity. Mouth: Dentition intact. No ulcers or lesions.  Lungs: Clear throughout to auscultation. Heart: Regular rate and rhythm, no murmur. Abdomen: Soft, nontender and nondistended. No masses or hepatomegaly. Normal bowel sounds x 4 quadrants.  Rectal: Deferred.  Musculoskeletal: Symmetrical with no gross deformities. Extremities: No edema. Neurological: Alert oriented x 4. No focal deficits.  Psychological: Alert and cooperative. Normal mood and affect  Assessment and Recommendations:  78 year old female with chronic loose stools, likely lactose intolerance component as her diarrhea improved after using lactose-free milk.  No abdominal pain.  No evidence of microscopic colitis/IBD per last colonoscopies, including the most recent colonoscopy 08/2024. - Continues lactose-free milk - Lactaid 2 tabs with each dairy product - Benefiber 1 tablespoon daily to bulk up stool as tolerated - Consider trial with Cholestyramine if loose stools persist or worsen  History of colon polyps. Her most recent colonoscopy was 08/30/2024 identified 6 polyps removed from the colon, path report consistent with sessile serrated and tubular adenomas.  - Next colon polyp surveillance colonoscopy due 08/2027 if medically appropriate as the patient will be 78 yrs old at that tilme  Today's encounter was 25 minutes which included precharting, chart/result review, history/exam, face-to-face time used for counseling, formulating a treatment plan with follow-up and documentation.
